# Patient Record
Sex: Male | Born: 1970
Health system: Southern US, Community
[De-identification: ages and names within clinical notes are randomized; demographics above are authoritative.]

## PROBLEM LIST (undated history)

## (undated) DIAGNOSIS — G4730 Sleep apnea, unspecified: Secondary | ICD-10-CM

## (undated) DIAGNOSIS — E119 Type 2 diabetes mellitus without complications: Secondary | ICD-10-CM

## (undated) DIAGNOSIS — I10 Essential (primary) hypertension: Secondary | ICD-10-CM

## (undated) DIAGNOSIS — L409 Psoriasis, unspecified: Secondary | ICD-10-CM

## (undated) HISTORY — PX: FRACTURE SURGERY: SHX138

## (undated) HISTORY — DX: Type 2 diabetes mellitus without complications: E11.9

## (undated) HISTORY — DX: Sleep apnea, unspecified: G47.30

## (undated) HISTORY — DX: Psoriasis, unspecified: L40.9

---

## 2008-09-21 ENCOUNTER — Ambulatory Visit: Payer: Self-pay | Admitting: Internal Medicine

## 2009-07-12 ENCOUNTER — Ambulatory Visit: Payer: Self-pay | Admitting: Podiatry

## 2009-07-13 ENCOUNTER — Ambulatory Visit: Payer: Self-pay | Admitting: Podiatry

## 2010-09-18 ENCOUNTER — Ambulatory Visit: Payer: Self-pay | Admitting: Family Medicine

## 2012-09-20 ENCOUNTER — Ambulatory Visit: Payer: Self-pay | Admitting: Emergency Medicine

## 2012-09-20 LAB — DOT URINE DIP
Blood: NEGATIVE
Specific Gravity: 1.01 (ref 1.003–1.030)

## 2013-08-04 ENCOUNTER — Ambulatory Visit: Payer: Self-pay

## 2013-08-04 LAB — DOT URINE DIP
Blood: NEGATIVE
Glucose,UR: NEGATIVE mg/dL (ref 0–75)
PROTEIN: NEGATIVE
SPECIFIC GRAVITY: 1.025 (ref 1.003–1.030)

## 2014-07-04 ENCOUNTER — Ambulatory Visit: Payer: Self-pay | Admitting: Family Medicine

## 2018-05-17 ENCOUNTER — Ambulatory Visit
Admission: EM | Admit: 2018-05-17 | Discharge: 2018-05-17 | Disposition: A | Discharge status: Home / Self Care | Payer: Managed Care, Other (non HMO) | Attending: Family Medicine | Admitting: Family Medicine

## 2018-05-17 ENCOUNTER — Encounter: Payer: Self-pay | Admitting: Emergency Medicine

## 2018-05-17 ENCOUNTER — Other Ambulatory Visit: Payer: Self-pay

## 2018-05-17 DIAGNOSIS — R509 Fever, unspecified: Secondary | ICD-10-CM

## 2018-05-17 DIAGNOSIS — R05 Cough: Secondary | ICD-10-CM | POA: Diagnosis not present

## 2018-05-17 DIAGNOSIS — R112 Nausea with vomiting, unspecified: Secondary | ICD-10-CM

## 2018-05-17 DIAGNOSIS — J101 Influenza due to other identified influenza virus with other respiratory manifestations: Secondary | ICD-10-CM | POA: Diagnosis not present

## 2018-05-17 DIAGNOSIS — Z87891 Personal history of nicotine dependence: Secondary | ICD-10-CM | POA: Diagnosis not present

## 2018-05-17 DIAGNOSIS — J111 Influenza due to unidentified influenza virus with other respiratory manifestations: Secondary | ICD-10-CM

## 2018-05-17 LAB — RAPID INFLUENZA A&B ANTIGENS
Influenza A (ARMC): POSITIVE — AB
Influenza B (ARMC): NEGATIVE

## 2018-05-17 MED ORDER — OSELTAMIVIR PHOSPHATE 75 MG PO CAPS: 75.0000 mg | ORAL_CAPSULE | Freq: Two times a day (BID) | ORAL | 0 refills | Status: DC

## 2018-05-17 NOTE — ED Triage Notes (Signed)
Pt c/o cough, headache, diarrhea, body aches, and vomiting. Started 3 days ago. Thinks he may have had a fever this morning but did not check it.

## 2018-05-17 NOTE — ED Provider Notes (Signed)
[facility]  CSN: [id number] Arrival date & time: 05/17/18  1845  History   Chief Complaint Suspected influenza  HPI  47 year old male presents with multiple symptoms.  Started on Sunday morning. Reports nausea, vomiting, diarrhea, body aches, chills, headache, cough, subjective fever. No reported sick contacts. No known exacerbating or relieving factors. Symptoms are severe. No other symptoms. No other complaints.   PMH, Surgical Hx, Family Hx, Social History reviewed and updated as below.  PMH: Obesity  Past Surgical History:  Procedure Laterality Date  . FRACTURE SURGERY     right lower leg   Home Medications    Prior to Admission medications   Medication Sig Start Date End Date Taking? Authorizing Provider  oseltamivir (TAMIFLU) 75 MG capsule Take 1 capsule (75 mg total) by mouth every 12 (twelve) hours. 05/17/18   Tommie Samsook, Sheritta Deeg G, DO    Family History Family History  Problem Relation Age of Onset  . Diabetes Mother     Social History Social History   Tobacco Use  . Smoking status: Former Games developermoker  . Smokeless tobacco: Never Used  Substance Use Topics  . Alcohol use: Yes    Frequency: Never  . Drug use: Never     Allergies   Percocet [oxycodone-acetaminophen]   Review of Systems Review of Systems Per HPI  Physical Exam Triage Vital Signs ED Triage Vitals  Enc Vitals Group     BP 05/17/18 1915 109/66     Pulse Rate 05/17/18 1915 94     Resp 05/17/18 1915 18     Temp 05/17/18 1915 99.7 F (37.6 C)     Temp Source 05/17/18 1915 Oral     SpO2 05/17/18 1915 98 %     Weight 05/17/18 1911 296 lb (134.3 kg)     Height 05/17/18 1911 5\' 7"  (1.702 m)     Head Circumference --      Peak Flow --      Pain Score 05/17/18 1911 8     Pain Loc --      Pain Edu? --      Excl. in [place]? --    Updated Vital Signs BP 109/66 (BP Location: Left Arm)   Pulse 94   Temp 99.7 F (37.6 C) (Oral)   Resp 18   Ht 5\' 7"  (1.702 m)   Wt 134.3 kg   SpO2 98%    BMI 46.36 kg/m   Visual Acuity Right Eye Distance:   Left Eye Distance:   Bilateral Distance:    Right Eye Near:   Left Eye Near:    Bilateral Near:     Physical Exam Vitals signs and nursing note reviewed.  Constitutional:      General: He is not in acute distress. HENT:     Head: Normocephalic and atraumatic.  Eyes:     General:        Right eye: No discharge.        Left eye: No discharge.     Conjunctiva/sclera: Conjunctivae normal.  Cardiovascular:     Rate and Rhythm: Normal rate and regular rhythm.  Pulmonary:     Effort: Pulmonary effort is normal.     Breath sounds: No wheezing, rhonchi or rales.  Neurological:     Mental Status: He is alert.  Psychiatric:        Mood and Affect: Mood normal.        Behavior: Behavior normal.    UC Treatments / Results  Labs (  all labs ordered are listed, but only abnormal results are displayed) Labs Reviewed  RAPID INFLUENZA A&B ANTIGENS ([facility] ONLY) - Abnormal; Notable for the following components:      Result Value   Influenza A ([facility]) POSITIVE (*)    All other components within normal limits    EKG None  Radiology No results found.  Procedures Procedures (including critical care time)  Medications Ordered in UC Medications - No data to display  Initial Impression / Assessment and Plan / UC Course  I have reviewed the triage vital signs and the nursing notes.  Pertinent labs & imaging results that were available during my care of the patient were reviewed by me and considered in my medical decision making (see chart for details).     47 year old male presents with influenza. Treating with tamiflu.  Final Clinical Impressions(s) / UC Diagnoses   Final diagnoses:  Influenza   Discharge Instructions   None    ED Prescriptions    Medication Sig Dispense Auth. Provider   oseltamivir (TAMIFLU) 75 MG capsule Take 1 capsule (75 mg total) by mouth every 12 (twelve) hours. 10 capsule Tommie Samsook, Elliette Seabolt G, DO      Controlled Substance Prescriptions [place] Controlled Substance Registry consulted? Not Applicable   Tommie SamsCook, Matty Vanroekel G, DO 05/18/18 0004

## 2019-04-16 ENCOUNTER — Other Ambulatory Visit: Payer: Self-pay

## 2019-04-16 ENCOUNTER — Ambulatory Visit
Admission: EM | Admit: 2019-04-16 | Discharge: 2019-04-16 | Disposition: A | Discharge status: Home / Self Care | Payer: BC Managed Care – PPO | Attending: Emergency Medicine | Admitting: Emergency Medicine

## 2019-04-16 ENCOUNTER — Encounter: Payer: Self-pay | Admitting: Emergency Medicine

## 2019-04-16 DIAGNOSIS — R1032 Left lower quadrant pain: Secondary | ICD-10-CM | POA: Diagnosis not present

## 2019-04-16 DIAGNOSIS — K5792 Diverticulitis of intestine, part unspecified, without perforation or abscess without bleeding: Secondary | ICD-10-CM

## 2019-04-16 MED ORDER — ACETAMINOPHEN 500 MG PO TABS
1000.0000 mg | ORAL_TABLET | Freq: Once | ORAL | Status: AC
  Administered 2019-04-16: 1000 mg via ORAL

## 2019-04-16 MED ORDER — KETOROLAC TROMETHAMINE 60 MG/2ML IM SOLN
30.0000 mg | Freq: Once | INTRAMUSCULAR | Status: AC
  Administered 2019-04-16: 30 mg via INTRAMUSCULAR

## 2019-04-16 MED ORDER — IBUPROFEN 600 MG PO TABS: 600.0000 mg | ORAL_TABLET | Freq: Four times a day (QID) | ORAL | 0 refills | Status: DC | PRN

## 2019-04-16 MED ORDER — AMOXICILLIN-POT CLAVULANATE 875-125 MG PO TABS: 1.0000 | ORAL_TABLET | Freq: Three times a day (TID) | ORAL | 0 refills | Status: AC

## 2019-04-16 NOTE — Discharge Instructions (Addendum)
600 mg ibuprofen combined with 1 g of Tylenol 3-4 times a day as needed for pain.  Finish the Augmentin, even if you feel better.  Here is a list of primary care providers who are taking new patients:  Dr. Otilio Miu, Dr. Adline Potter [address] [place] [address] [place] [address]  [phone]  [facility] [address] [place], [place] [address] [phone]  [facility]  [phone] [place], [place] [address]  Here are clinics/ other resources who will see you if you do not have insurance. Some have certain criteria that you must meet. Call them and find out what they are:  [facility]: [address] [place], [place] [address] Phone: [phone] Hours: First and Third Saturdays of each Month, 9 a.m. - 1 p.m.  [facility]: [address], [place], [place], [place] [address] Phone: [phone] Hours: Tuesday, 4 p.m. - 8 p.m. Thursday, 1 p.m. - 8 p.m. Wednesday, 9 a.m. - [facility] [address] [place], [place] [address] Phone: [phone] Pharmacy Phone Number: [phone] Dental Phone Number: [phone] [place] Help: [phone]  Dental Hours: Monday - Thursday, 8 a.m. - 6 p.m.  [facility] [address], [place], [place] [address] Phone: [phone] Pharmacy Phone Number: [phone] [facility] Insurance Help: [phone]  [facility] [address] [place], [place] [address] Phone: [phone] Pharmacy Phone Number: [phone] [facility] Insurance Help: [phone]  [facility] [address] [place], [place] [address] Phone: [phone] [facility] Insurance Help: [phone]   [place] [place],  [address] Phone: [phone]  Go to www.goodrx.com to look up your medications. This will  give you a list of where you can find your prescriptions at the most affordable prices. Or ask the pharmacist what the cash price is, or if they have any other discount programs available to help make your medication more affordable. This can be less expensive than what you would pay with insurance.

## 2019-04-16 NOTE — ED Triage Notes (Signed)
Pt c/o LLQ pain. Started about 2 days ago. He states that he has had this exactly pain before and was given antibiotics for it. Pt states pain is intermittent and when he has the pain it is a 7/10 and sharp.

## 2019-04-16 NOTE — ED Provider Notes (Signed)
HPI  SUBJECTIVE:  Brandon Green is a 48 y.o. male who presents with 2 days of intermittent sharp, stabbing left lower quadrant pain.  It does not migrate, radiate.  Lasts 15 to 20 seconds and then resolves.  It is not getting worse or more frequent.  Had a normal bowel movement this morning.  Denies nausea, vomiting, fevers, abdominal distention, back pain, testicular pain, swelling, penile rash, discharge, urinary complaints.  No antipyretic in the past 4 to 6 hours.  No anorexia.  States he had identical pain 3 years ago and was treated with antibiotics.  Not sure what the diagnosis was.  He is in a  long-term monogamous sexual relationship with a male who is asymptomatic.  STDs are not a concern today.  He tried applying pressure, stretching, flexing his left leg.  These seem to help.  No aggravating factors.  The pain Is not associated with eating, drinking, urination, defecation, movement.  The car ride over here was not painful.  Questionable history of diverticulitis.  No history of abdominal surgeries, UTI, pyelonephritis, gonorrhea, chlamydia, orchitis, epididymitis, chronic kidney disease, diabetes, hypertension.  PMD: None.    History reviewed. No pertinent past medical history.  Past Surgical History:  Procedure Laterality Date  . FRACTURE SURGERY     right lower leg    Family History  Problem Relation Age of Onset  . Diabetes Mother     Social History   Tobacco Use  . Smoking status: Former Research scientist (life sciences)  . Smokeless tobacco: Never Used  Substance Use Topics  . Alcohol use: Yes    Frequency: Never  . Drug use: Never    No current facility-administered medications for this encounter.   Current Outpatient Medications:  .  amoxicillin-clavulanate (AUGMENTIN) 875-125 MG tablet, Take 1 tablet by mouth 3 (three) times daily for 7 days., Disp: 21 tablet, Rfl: 0 .  ibuprofen (ADVIL) 600 MG tablet, Take 1 tablet (600 mg total) by mouth every 6 (six) hours as needed., Disp: 30  tablet, Rfl: 0  Allergies  Allergen Reactions  . Percocet [Oxycodone-Acetaminophen] Itching     ROS  As noted in HPI.   Physical Exam  BP (!) 132/94 (BP Location: Right Arm)   Pulse 83   Temp 98.2 F (36.8 C) (Oral)   Resp 20   Ht 5\' 8"  (1.727 m)   Wt 130.6 kg   SpO2 98%   BMI 43.79 kg/m   Constitutional: Well developed, well nourished, no acute distress having spasms of pain Eyes:  EOMI, conjunctiva normal bilaterally HENT: Normocephalic, atraumatic,mucus membranes moist Respiratory: Normal inspiratory effort Cardiovascular: Normal rate GI: Normal appearance, obese, soft.  Nontender, nondistended.  Diminished but present bowel sounds.  No guarding, rebound.   GU: Normal circumcised male, testes descended bilaterally.  No testicular or epididymal swelling, tenderness.  Normal scrotum.  No appreciable inguinal hernia.  Skin: No rash, skin intact Musculoskeletal: no deformities Neurologic: Alert & oriented x 3, no focal neuro deficits Psychiatric: Speech and behavior appropriate   ED Course   Medications  ketorolac (TORADOL) injection 30 mg (30 mg Intramuscular Given 04/16/19 1053)  acetaminophen (TYLENOL) tablet 1,000 mg (1,000 mg Oral Given 04/16/19 1052)    No orders of the defined types were placed in this encounter.   No results found for this or any previous visit (from the past 24 hour(s)). No results found.  ED Clinical Impression  1. Diverticulitis      ED Assessment/Plan  will give Toradol 30 mg IM combined  with 1 g of Tylenol.  Suspect uncomplicated diverticulitis.  In the differential is nephrolithiasis, colitis.  His abdomen is benign.  Doubt perforation, obstruction, abscess.  He has very localized pain in the left lower quadrant.  It does not appear to be over the suprapubic or left flank region, or from a GU cause.  He has no CVAT.  Considered doing labs, patient is otherwise young, healthy, has no history of chronic kidney disease/renal  insufficiency.  I discussed doing labs with the patient, but I will be treating him empirically for diverticulitis.  He will go to the ER if he does not get better in 24 to 36 hours, if he gets worse in any way.  Home with Augmentin 875 mg 3 times daily for 7 days, Tylenol/ibuprofen combination 3-4 times a day.  Also providing primary care list for routine care.  Discussed MDM, treatment plan, and plan for follow-up with patient. Discussed sn/sx that should prompt return to the ED. patient agrees with plan.   Meds ordered this encounter  Medications  . ketorolac (TORADOL) injection 30 mg  . acetaminophen (TYLENOL) tablet 1,000 mg  . amoxicillin-clavulanate (AUGMENTIN) 875-125 MG tablet    Sig: Take 1 tablet by mouth 3 (three) times daily for 7 days.    Dispense:  21 tablet    Refill:  0  . ibuprofen (ADVIL) 600 MG tablet    Sig: Take 1 tablet (600 mg total) by mouth every 6 (six) hours as needed.    Dispense:  30 tablet    Refill:  0    *This clinic note was created using Scientist, clinical (histocompatibility and immunogenetics). Therefore, there may be occasional mistakes despite careful proofreading.   ?    Domenick Gong, MD 04/17/19 1554

## 2019-07-06 DIAGNOSIS — Z79899 Other long term (current) drug therapy: Secondary | ICD-10-CM | POA: Diagnosis not present

## 2019-07-06 DIAGNOSIS — L400 Psoriasis vulgaris: Secondary | ICD-10-CM | POA: Diagnosis not present

## 2019-09-30 ENCOUNTER — Ambulatory Visit
Admission: EM | Admit: 2019-09-30 | Discharge: 2019-09-30 | Disposition: A | Discharge status: Home / Self Care | Payer: BC Managed Care – PPO | Attending: Family Medicine | Admitting: Family Medicine

## 2019-09-30 ENCOUNTER — Encounter: Payer: Self-pay | Admitting: Emergency Medicine

## 2019-09-30 ENCOUNTER — Other Ambulatory Visit: Payer: Self-pay

## 2019-09-30 DIAGNOSIS — G5621 Lesion of ulnar nerve, right upper limb: Secondary | ICD-10-CM | POA: Diagnosis not present

## 2019-09-30 NOTE — Discharge Instructions (Signed)
Rest, ice, anti-inflammatories, wrist splint Follow up with orthopedist if no improvment

## 2019-09-30 NOTE — ED Triage Notes (Signed)
Patient c/o numbness in his right wrist and right 4th and 5th finger that started this morning. Patient denies any pain. Patient denies any injury.

## 2019-10-02 NOTE — ED Provider Notes (Signed)
[facility]    CSN: [id number] Arrival date & time: 09/30/19  0945      History   Chief Complaint Chief Complaint  Patient presents with  . Numbness    HPI Brandon Green is a 49 y.o. male.   49 yo male with a c/o right wrist and 4th and 5th finger numbness since this morning. Denies any fall or other traumatic injury. 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 Denies any fevers, chills.      History reviewed. No pertinent past medical history.  There are no problems to display for this patient.   Past Surgical History:  Procedure Laterality Date  . FRACTURE SURGERY     right lower leg       Home Medications    Prior to Admission medications   Medication Sig Start Date End Date Taking? Authorizing Provider  ibuprofen (ADVIL) 600 MG tablet Take 1 tablet (600 mg total) by mouth every 6 (six) hours as needed. 04/16/19   Melynda Ripple, MD    Family History Family History  Problem Relation Age of Onset  . Diabetes Mother     Social History Social History   Tobacco Use  . Smoking status: Former Research scientist (life sciences)  . Smokeless tobacco: Never Used  Substance Use Topics  . Alcohol use: Yes  . Drug use: Never     Allergies   Percocet [oxycodone-acetaminophen]   Review of Systems Review of Systems   Physical Exam Triage Vital Signs ED Triage Vitals  Enc Vitals Group     BP 09/30/19 0957 131/84     Pulse Rate 09/30/19 0957 80     Resp 09/30/19 0957 16     Temp 09/30/19 0957 98.1 F (36.7 C)     Temp Source 09/30/19 0957 Oral     SpO2 09/30/19 0957 100 %     Weight 09/30/19 0955 290 lb (131.5 kg)     Height 09/30/19 0955 5\' 6"  (1.676 m)     Head Circumference --      Peak Flow --      Pain Score 09/30/19 0955 0     Pain Loc --      Pain Edu? --      Excl. in [place]? --    No data found.  Updated Vital Signs BP 131/84 (BP Location: Right Arm)   Pulse 80   Temp 98.1 F  (36.7 C) (Oral)   Resp 16   Ht 5\' 6"  (1.676 m)   Wt 131.5 kg   SpO2 100%   BMI 46.81 kg/m   Visual Acuity Right Eye Distance:   Left Eye Distance:   Bilateral Distance:    Right Eye Near:   Left Eye Near:    Bilateral Near:     Physical Exam Vitals and nursing note reviewed.  Constitutional:      General: He is not in acute distress.    Appearance: He is not toxic-appearing or diaphoretic.  Cardiovascular:     Pulses: Normal pulses.  Musculoskeletal:     Right wrist: No swelling, deformity, effusion, lacerations, tenderness, bony tenderness, snuff box tenderness or crepitus. Normal range of motion. Normal pulse.  Neurological:     Mental Status: He is alert.      UC Treatments / Results  Labs (all labs ordered are listed, but only abnormal results are displayed) Labs Reviewed - No data to display  EKG  Radiology No results found.  Procedures Procedures (including critical care time)  Medications Ordered in UC Medications - No data to display  Initial Impression / Assessment and Plan / UC Course  I have reviewed the triage vital signs and the nursing notes.  Pertinent labs & imaging results that were available during my care of the patient were reviewed by me and considered in my medical decision making (see chart for details).      Final Clinical Impressions(s) / UC Diagnoses   Final diagnoses:  Ulnar neuropathy at wrist, right     Discharge Instructions     Rest, ice, anti-inflammatories, wrist splint Follow up with orthopedist if no improvment    ED Prescriptions    None      1. diagnosis reviewed with patient 2. Recommend supportive treatment as above 3. Follow up with orthopedist if no improvement 4. Follow-up prn    PDMP not reviewed this encounter.   Payton Mccallum, MD 10/02/19 1745

## 2019-10-03 DIAGNOSIS — Z79899 Other long term (current) drug therapy: Secondary | ICD-10-CM | POA: Diagnosis not present

## 2019-10-03 DIAGNOSIS — L400 Psoriasis vulgaris: Secondary | ICD-10-CM | POA: Diagnosis not present

## 2019-10-25 ENCOUNTER — Encounter: Payer: Self-pay | Admitting: Emergency Medicine

## 2019-10-25 ENCOUNTER — Other Ambulatory Visit: Payer: Self-pay

## 2019-10-25 ENCOUNTER — Ambulatory Visit
Admission: EM | Admit: 2019-10-25 | Discharge: 2019-10-25 | Disposition: A | Discharge status: Home / Self Care | Payer: BC Managed Care – PPO | Attending: Emergency Medicine | Admitting: Emergency Medicine

## 2019-10-25 DIAGNOSIS — Z87891 Personal history of nicotine dependence: Secondary | ICD-10-CM | POA: Insufficient documentation

## 2019-10-25 DIAGNOSIS — R5381 Other malaise: Secondary | ICD-10-CM | POA: Insufficient documentation

## 2019-10-25 DIAGNOSIS — Z833 Family history of diabetes mellitus: Secondary | ICD-10-CM | POA: Insufficient documentation

## 2019-10-25 DIAGNOSIS — Z20822 Contact with and (suspected) exposure to covid-19: Secondary | ICD-10-CM | POA: Insufficient documentation

## 2019-10-25 DIAGNOSIS — R519 Headache, unspecified: Secondary | ICD-10-CM | POA: Insufficient documentation

## 2019-10-25 DIAGNOSIS — Z7984 Long term (current) use of oral hypoglycemic drugs: Secondary | ICD-10-CM | POA: Insufficient documentation

## 2019-10-25 DIAGNOSIS — R5383 Other fatigue: Secondary | ICD-10-CM | POA: Diagnosis not present

## 2019-10-25 DIAGNOSIS — E119 Type 2 diabetes mellitus without complications: Secondary | ICD-10-CM | POA: Diagnosis not present

## 2019-10-25 LAB — BASIC METABOLIC PANEL
Anion gap: 9 (ref 5–15)
BUN: 11 mg/dL (ref 6–20)
CO2: 25 mmol/L (ref 22–32)
Calcium: 8.8 mg/dL — ABNORMAL LOW (ref 8.9–10.3)
Chloride: 102 mmol/L (ref 98–111)
Creatinine, Ser: 0.64 mg/dL (ref 0.61–1.24)
GFR calc Af Amer: >60 mL/min (ref >60)
GFR calc non Af Amer: >60 mL/min (ref >60)
Glucose, Bld: 238 mg/dL — ABNORMAL HIGH (ref 70–99)
Potassium: 4 mmol/L (ref 3.5–5.1)
Sodium: 136 mmol/L (ref 135–145)

## 2019-10-25 LAB — URINALYSIS, COMPLETE (UACMP) WITH MICROSCOPIC
Bilirubin Urine: NEGATIVE
Glucose, UA: >1000 mg/dL — AB
Hgb urine dipstick: NEGATIVE
Leukocytes,Ua: NEGATIVE
Nitrite: NEGATIVE
Protein, ur: NEGATIVE mg/dL
RBC / HPF: NONE SEEN RBC/hpf (ref 0–5)
Specific Gravity, Urine: 1.025 (ref 1.005–1.030)
pH: 6.5 (ref 5.0–8.0)

## 2019-10-25 LAB — HEMOGLOBIN A1C
Hgb A1c MFr Bld: 10.8 % — ABNORMAL HIGH (ref 4.8–5.6)
Mean Plasma Glucose: 263.26 mg/dL

## 2019-10-25 LAB — CK: Total CK: 321 U/L (ref 49–397)

## 2019-10-25 MED ORDER — METFORMIN HCL 500 MG PO TABS
500.0000 mg | ORAL_TABLET | Freq: Every day | ORAL | 0 refills | Status: DC
Start: 2019-10-25 — End: 2019-12-06

## 2019-10-25 MED ORDER — FREESTYLE FREEDOM KIT: 1.0000 [IU] | PACK | Freq: Two times a day (BID) | 0 refills | Status: AC

## 2019-10-25 MED ORDER — GLUCOSE BLOOD VI STRP: ORAL_STRIP | 2 refills | Status: AC

## 2019-10-25 NOTE — ED Provider Notes (Signed)
HPI  SUBJECTIVE:  Brandon Green is a 49 y.o. male who reports a diffuse gradual onset headache starting yesterday, body aches in his arms and neck after working outside in the heat for 2 days in a row over the weekend.  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  States that he is extremely thirsty.  States he vomited up the Gatorade yesterday, but has been tolerating p.o. since then.  States that the heat made him feel "dizzy".  He reports dark, tea colored urine which has lightened up and become normal color after pushing fluids.  No fevers, nasal congestion, sore throat, loss of sense of smell or taste, cough, shortness of breath nausea, abdominal pain, diarrhea.  No known Covid exposure.  He has not yet gotten his vaccine.  No lower extremity edema, neck stiffness, photophobia, phonophobia.  He reports fatigue, malaise.  He has never had symptoms like this before.  He has tried pushing fluids and taking 400 mg of Advil 2-3 times a day with improvement in his headache.  No aggravating factors.  On further history, he reports polyuria and a 15 pound unintentional weight loss in the past month to month and a half.  Past medical history negative for rhabdomyolysis, diabetes, hypertension.  PMD: None.   History reviewed. No pertinent past medical history.  Past Surgical History:  Procedure Laterality Date  . FRACTURE SURGERY     right lower leg    Family History  Problem Relation Age of Onset  . Diabetes Mother     Social History   Tobacco Use  . Smoking status: Former Research scientist (life sciences)  . Smokeless tobacco: Never Used  Substance Use Topics  . Alcohol use: Yes  . Drug use: Never    No current facility-administered medications for this encounter.  Current Outpatient Medications:  .  Blood Glucose Monitoring Suppl (FREESTYLE FREEDOM) KIT, 1 Units by Does not apply route in the morning and at bedtime., Disp: 1 kit, Rfl: 0 .  glucose blood test strip, Check your sugar in  the morning before you eat breakfast, and one hour after a meal., Disp: 100 each, Rfl: 2 .  ibuprofen (ADVIL) 600 MG tablet, Take 1 tablet (600 mg total) by mouth every 6 (six) hours as needed., Disp: 30 tablet, Rfl: 0 .  metFORMIN (GLUCOPHAGE) 500 MG tablet, Take 1 tablet (500 mg total) by mouth daily with breakfast. 1 tab po in the a.m. for 1 week, 1 tab twice a day for 1 week, then 1 tab in am and 2 tabs at night for the next week, Disp: 42 tablet, Rfl: 0  Allergies  Allergen Reactions  . Percocet [Oxycodone-Acetaminophen] Itching     ROS  As noted in HPI.   Physical Exam  BP 137/87 (BP Location: Right Arm)   Pulse 77   Temp 98.2 F (36.8 C) (Oral)   Resp 18   Ht _0  (1.676 m)   Wt 129.3 kg   SpO2 98%   BMI 46.00 kg/m   Constitutional: Well developed, well nourished, no acute distress Eyes: PERRL, EOMI, conjunctiva normal bilaterally.  No photophobia HENT: Normocephalic, atraumatic,mucus membranes moist,  No nasal congestion, no sinus tenderness. No temporal artery tenderness.  Neck: no cervical LN + Right trapezial muscle tenderness. No meningismus Respiratory: normal inspiratory effort lungs clear bilaterally Cardiovascular: Normal rate, regular rhythm GI:  nondistended skin: No rash, skin intact Musculoskeletal: No edema, no tenderness, no deformities Neurologic: Alert & oriented x 3, CN  III-XII intact, coordination grossly normal tandem gait steady Psychiatric: Speech and behavior appropriate   ED Course  Medications - No data to display  Orders Placed This Encounter  Procedures  . SARS CORONAVIRUS 2 (TAT 6-24 HRS) Nasopharyngeal Nasopharyngeal Swab    Standing Status:   Standing    Number of Occurrences:   1    Order Specific Question:   Is this test for diagnosis or screening    Answer:   Diagnosis of ill patient    Order Specific Question:   Symptomatic for COVID-19 as defined by CDC    Answer:   Yes    Order Specific Question:   Date of Symptom Onset     Answer:   10/23/2019    Order Specific Question:   Hospitalized for COVID-19    Answer:   No    Order Specific Question:   Admitted to ICU for COVID-19    Answer:   No    Order Specific Question:   Previously tested for COVID-19    Answer:   No    Order Specific Question:   Resident in a congregate (group) care setting    Answer:   No    Order Specific Question:   Employed in healthcare setting    Answer:   No    Order Specific Question:   Has patient completed COVID vaccination(s) (2 doses of Pfizer/Moderna 1 dose of [employer])    Answer:   No  . Basic metabolic panel    Standing Status:   Standing    Number of Occurrences:   1  . Urinalysis, Complete w Microscopic    Standing Status:   Standing    Number of Occurrences:   1  . CK    Standing Status:   Standing    Number of Occurrences:   1  . Hemoglobin A1c    Standing Status:   Standing    Number of Occurrences:   1   Results for orders placed or performed during the hospital encounter of 10/25/19 (from the past 24 hour(s))  SARS CORONAVIRUS 2 (TAT 6-24 HRS) Nasopharyngeal Nasopharyngeal Swab     Status: None   Collection Time: 10/25/19  5:35 PM   Specimen: Nasopharyngeal Swab  Result Value Ref Range   SARS Coronavirus 2 NEGATIVE NEGATIVE  Basic metabolic panel     Status: Abnormal   Collection Time: 10/25/19  6:55 PM  Result Value Ref Range   Sodium 136 135 - 145 mmol/L   Potassium 4.0 3.5 - 5.1 mmol/L   Chloride 102 98 - 111 mmol/L   CO2 25 22 - 32 mmol/L   Glucose, Bld 238 (H) 70 - 99 mg/dL   BUN 11 6 - 20 mg/dL   Creatinine, Ser 0.64 0.61 - 1.24 mg/dL   Calcium 8.8 (L) 8.9 - 10.3 mg/dL   GFR calc non Af Amer >60 >60 mL/min   GFR calc Af Amer >60 >60 mL/min   Anion gap 9 5 - 15  Urinalysis, Complete w Microscopic     Status: Abnormal   Collection Time: 10/25/19  6:55 PM  Result Value Ref Range   Color, Urine YELLOW YELLOW   APPearance CLEAR CLEAR   Specific Gravity, Urine 1.025 1.005 - 1.030   pH  6.5 5.0 - 8.0   Glucose, UA >1,000 (A) NEGATIVE mg/dL   Hgb urine dipstick NEGATIVE NEGATIVE   Bilirubin Urine NEGATIVE NEGATIVE   Ketones, ur TRACE (A) NEGATIVE mg/dL  Protein, ur NEGATIVE NEGATIVE mg/dL   Nitrite NEGATIVE NEGATIVE   Leukocytes,Ua NEGATIVE NEGATIVE   Squamous Epithelial / LPF 0-5 0 - 5   WBC, UA 0-5 0 - 5 WBC/hpf   RBC / HPF NONE SEEN 0 - 5 RBC/hpf   Bacteria, UA FEW (A) NONE SEEN   Mucus PRESENT   CK     Status: None   Collection Time: 10/25/19  6:55 PM  Result Value Ref Range   Total CK 321 49 - 397 U/L  Hemoglobin A1c     Status: Abnormal   Collection Time: 10/25/19  6:55 PM  Result Value Ref Range   Hgb A1c MFr Bld 10.8 (H) 4.8 - 5.6 %   Mean Plasma Glucose 263.26 mg/dL   No results found.   ED Clinical Impression  1. New onset type 2 diabetes mellitus [facility])     ED Assessment/Plan  Suspect dehydration.  Will get UA, CK, BMP to rule out rhabdomyolysis.  No evidence of meningitis.  Patient given liter of Pedialyte to drink.  Patient has new onset diabetes.  He has glucosria, trace ketones and his glucose is 238.  His CK is within normal limits.  His kidney function is normal.  He is not acidotic.  Doubt DKA.   Sending hemoglobin A1c for baseline.  Starting on Metformin, rx'ing glucometer and glucose strips.  Check blood sugar twice a day.  will provide primary care list for further care.  Work note for 2 days.  Hemoglobin A1c 10.8. Covid test negative.  Discussed labs, MDM, plan for follow up, signs and sx that should prompt return to ER. Pt agrees with plan  Meds ordered this encounter  Medications  . metFORMIN (GLUCOPHAGE) 500 MG tablet    Sig: Take 1 tablet (500 mg total) by mouth daily with breakfast. 1 tab po in the a.m. for 1 week, 1 tab twice a day for 1 week, then 1 tab in am and 2 tabs at night for the next week    Dispense:  42 tablet    Refill:  0  . glucose blood test strip    Sig: Check your sugar in the morning before you eat  breakfast, and one hour after a meal.    Dispense:  100 each    Refill:  2  . Blood Glucose Monitoring Suppl (FREESTYLE FREEDOM) KIT    Sig: 1 Units by Does not apply route in the morning and at bedtime.    Dispense:  1 kit    Refill:  0    *This clinic note was created using Lobbyist. Therefore, there may be occasional mistakes despite careful proofreading.  ?    Melynda Ripple, MD 10/26/19 1024

## 2019-10-25 NOTE — ED Triage Notes (Signed)
Patient states he was working outside on Saturday and started to feel light headed in the heat. He states Sunday he went out and cut grass all day. He reports a headache that has not resolved since yesterday.

## 2019-10-25 NOTE — Discharge Instructions (Addendum)
You have diabetes.  I am sending off a hemoglobin A1c as a baseline to see how far advanced you are.  I am starting you on Metformin, a glucometer.  Check your sugar once before eating breakfast and 1 hour hour after dinner.  Keep a log of your blood sugars.  You must see a primary care provider as soon as you possibly can.  See list below.  Here is a list of primary care providers who are taking new patients:  Dr. Elizabeth Sauer [address] [place] [address] [phone]  [facility] [address] [place] [address]  [phone]  [facility] [address] [place], [place] [address] [phone]  [facility] [address] [place]  [phone] [place], [place] [address]  Here are clinics/ other resources who will see you if you do not have insurance. Some have certain criteria that you must meet. Call them and find out what they are:  [facility]: [address], [place], [place] [address] Phone: [phone] Hours: First and Third Saturdays of each Month, 9 a.m. - 1 p.m.  [facility]: [address], [address] [place], [place] [address] Phone: [phone] Hours: Tuesday, 4 p.m. - 8 p.m. Thursday, 1 p.m. - 8 p.m. Wednesday, 9 a.m. - [facility] [address], [place], [place] [address] Phone: [phone] Pharmacy Phone Number: [phone] Dental Phone Number: [phone] [facility] Insurance Help: [phone]  Dental Hours: Monday - Thursday, 8 a.m. - 6 p.m.  [facility] [address], [place], [place] [address] Phone: [phone] Pharmacy Phone Number: [phone] [facility] Insurance Help: [phone]  [facility] [address] [place] [place], [place] [address] Phone: [phone] Pharmacy Phone Number: [phone] [facility] Insurance Help: [phone]  [facility] [address] [place], [place] [address] Phone:  [phone] [facility] Insurance Help: [phone]   [facility] [address] [place], [place] [address] Phone: [phone]  Go to www.goodrx.com to look up your medications. This will give you a list of where you can find your prescriptions at the most affordable prices. Or ask the pharmacist what the cash price is, or if they have any other discount programs available to help make your medication more affordable. This can be less expensive than what you would pay with insurance.

## 2019-10-26 LAB — SARS CORONAVIRUS 2 (TAT 6-24 HRS): SARS Coronavirus 2: NEGATIVE

## 2019-12-06 ENCOUNTER — Other Ambulatory Visit: Payer: Self-pay

## 2019-12-06 ENCOUNTER — Ambulatory Visit: Payer: BC Managed Care – PPO | Admitting: Family Medicine

## 2019-12-06 ENCOUNTER — Encounter: Payer: Self-pay | Admitting: Family Medicine

## 2019-12-06 VITALS — BP 130/80 | HR 84 | Ht 67.0 in | Wt 276.0 lb

## 2019-12-06 DIAGNOSIS — E119 Type 2 diabetes mellitus without complications: Secondary | ICD-10-CM

## 2019-12-06 DIAGNOSIS — Z7689 Persons encountering health services in other specified circumstances: Secondary | ICD-10-CM

## 2019-12-06 DIAGNOSIS — F17210 Nicotine dependence, cigarettes, uncomplicated: Secondary | ICD-10-CM | POA: Diagnosis not present

## 2019-12-06 DIAGNOSIS — Z6841 Body Mass Index (BMI) 40.0 and over, adult: Secondary | ICD-10-CM | POA: Diagnosis not present

## 2019-12-06 MED ORDER — METFORMIN HCL ER 750 MG PO TB24: 750.0000 mg | ORAL_TABLET | Freq: Every day | ORAL | 3 refills | Status: DC

## 2019-12-06 NOTE — Patient Instructions (Addendum)
Managing Your Hypertension Hypertension is commonly called high blood pressure. This is when the force of your blood pressing against the walls of your arteries is too strong. Arteries are blood vessels that carry blood from your heart throughout your body. Hypertension forces the heart to work harder to pump blood, and may cause the arteries to become narrow or stiff. Having untreated or uncontrolled hypertension can cause heart attack, stroke, kidney disease, and other problems. What are blood pressure readings? A blood pressure reading consists of a higher number over a lower number. Ideally, your blood pressure should be below 120/80. The first ("top") number is called the systolic pressure. It is a measure of the pressure in your arteries as your heart beats. The second ("bottom") number is called the diastolic pressure. It is a measure of the pressure in your arteries as the heart relaxes. What does my blood pressure reading mean? Blood pressure is classified into four stages. Based on your blood pressure reading, your health care provider may use the following stages to determine what type of treatment you need, if any. Systolic pressure and diastolic pressure are measured in a unit called mm Hg. Normal  Systolic pressure: below 120.  Diastolic pressure: below 80. Elevated  Systolic pressure: 120-129.  Diastolic pressure: below 80. Hypertension stage 1  Systolic pressure: 130-139.  Diastolic pressure: 80-89. Hypertension stage 2  Systolic pressure: 140 or above.  Diastolic pressure: 90 or above. What health risks are associated with hypertension? Managing your hypertension is an important responsibility. Uncontrolled hypertension can lead to:  A heart attack.  A stroke.  A weakened blood vessel (aneurysm).  Heart failure.  Kidney damage.  Eye damage.  Metabolic syndrome.  Memory and concentration problems. What changes can I make to manage my  hypertension? Hypertension can be managed by making lifestyle changes and possibly by taking medicines. Your health care provider will help you make a plan to bring your blood pressure within a normal range. Eating and drinking   Eat a diet that is high in fiber and potassium, and low in salt (sodium), added sugar, and fat. An example eating plan is called the DASH (Dietary Approaches to Stop Hypertension) diet. To eat this way: ? Eat plenty of fresh fruits and vegetables. Try to fill half of your plate at each meal with fruits and vegetables. ? Eat whole grains, such as whole wheat pasta, brown rice, or whole grain bread. Fill about one quarter of your plate with whole grains. ? Eat low-fat diary products. ? Avoid fatty cuts of meat, processed or cured meats, and poultry with skin. Fill about one quarter of your plate with lean proteins such as fish, chicken without skin, beans, eggs, and tofu. ? Avoid premade and processed foods. These tend to be higher in sodium, added sugar, and fat.  Reduce your daily sodium intake. Most people with hypertension should eat less than 1,500 mg of sodium a day.  Limit alcohol intake to no more than 1 drink a day for nonpregnant women and 2 drinks a day for men. One drink equals 12 oz of beer, 5 oz of wine, or 1 oz of hard liquor. Lifestyle  Work with your health care provider to maintain a healthy body weight, or to lose weight. Ask what an ideal weight is for you.  Get at least 30 minutes of exercise that causes your heart to beat faster (aerobic exercise) most days of the week. Activities may include walking, swimming, or biking.  Include exercise   to strengthen your muscles (resistance exercise), such as weight lifting, as part of your weekly exercise routine. Try to do these types of exercises for 30 minutes at least 3 days a week.  Do not use any products that contain nicotine or tobacco, such as cigarettes and e-cigarettes. If you need help quitting,  ask your health care provider.  Control any long-term (chronic) conditions you have, such as high cholesterol or diabetes. Monitoring  Monitor your blood pressure at home as told by your health care provider. Your personal target blood pressure may vary depending on your medical conditions, your age, and other factors.  Have your blood pressure checked regularly, as often as told by your health care provider. Working with your health care provider  Review all the medicines you take with your health care provider because there may be side effects or interactions.  Talk with your health care provider about your diet, exercise habits, and other lifestyle factors that may be contributing to hypertension.  Visit your health care provider regularly. Your health care provider can help you create and adjust your plan for managing hypertension. Will I need medicine to control my blood pressure? Your health care provider may prescribe medicine if lifestyle changes are not enough to get your blood pressure under control, and if:  Your systolic blood pressure is 130 or higher.  Your diastolic blood pressure is 80 or higher. Take medicines only as told by your health care provider. Follow the directions carefully. Blood pressure medicines must be taken as prescribed. The medicine does not work as well when you skip doses. Skipping doses also puts you at risk for problems. Contact a health care provider if:  You think you are having a reaction to medicines you have taken.  You have repeated (recurrent) headaches.  You feel dizzy.  You have swelling in your ankles.  You have trouble with your vision. Get help right away if:  You develop a severe headache or confusion.  You have unusual weakness or numbness, or you feel faint.  You have severe pain in your chest or abdomen.  You vomit repeatedly.  You have trouble breathing. Summary  Hypertension is when the force of blood pumping  through your arteries is too strong. If this condition is not controlled, it may put you at risk for serious complications.  Your personal target blood pressure may vary depending on your medical conditions, your age, and other factors. For most people, a normal blood pressure is less than 120/80.  Hypertension is managed by lifestyle changes, medicines, or both. Lifestyle changes include weight loss, eating a healthy, low-sodium diet, exercising more, and limiting alcohol. This information is not intended to replace advice given to you by your health care provider. Make sure you discuss any questions you have with your health care provider. Document Revised: 08/27/2018 Document Reviewed: 04/02/2016 Elsevier Patient Education  2020 Elsevier Inc.  Carbohydrate Counting for Diabetes Mellitus, Adult  Carbohydrate counting is a method of keeping track of how many carbohydrates you eat. Eating carbohydrates naturally increases the amount of sugar (glucose) in the blood. Counting how many carbohydrates you eat helps keep your blood glucose within normal limits, which helps you manage your diabetes (diabetes mellitus). It is important to know how many carbohydrates you can safely have in each meal. This is different for every person. A diet and nutrition specialist (registered dietitian) can help you make a meal plan and calculate how many carbohydrates you should have at each meal and   snack. Carbohydrates are found in the following foods:  Grains, such as breads and cereals.  Dried beans and soy products.  Starchy vegetables, such as potatoes, peas, and corn.  Fruit and fruit juices.  Milk and yogurt.  Sweets and snack foods, such as cake, cookies, candy, chips, and soft drinks. How do I count carbohydrates? There are two ways to count carbohydrates in food. You can use either of the methods or a combination of both. Reading "Nutrition Facts" on packaged food The "Nutrition Facts" list is  included on the labels of almost all packaged foods and beverages in the [place] It includes:  The serving size.  Information about nutrients in each serving, including the grams (g) of carbohydrate per serving. To use the "Nutrition Facts":  Decide how many servings you will have.  Multiply the number of servings by the number of carbohydrates per serving.  The resulting number is the total amount of carbohydrates that you will be having. Learning standard serving sizes of other foods When you eat carbohydrate foods that are not packaged or do not include "Nutrition Facts" on the label, you need to measure the servings in order to count the amount of carbohydrates:  Measure the foods that you will eat with a food scale or measuring cup, if needed.  Decide how many standard-size servings you will eat.  Multiply the number of servings by 15. Most carbohydrate-rich foods have about 15 g of carbohydrates per serving. ? For example, if you eat 8 oz (170 g) of strawberries, you will have eaten 2 servings and 30 g of carbohydrates (2 servings x 15 g = 30 g).  For foods that have more than one food mixed, such as soups and casseroles, you must count the carbohydrates in each food that is included. The following list contains standard serving sizes of common carbohydrate-rich foods. Each of these servings has about 15 g of carbohydrates:   hamburger bun or  English muffin.   oz (15 mL) syrup.   oz (14 g) jelly.  1 slice of bread.  1 six-inch tortilla.  3 oz (85 g) cooked rice or pasta.  4 oz (113 g) cooked dried beans.  4 oz (113 g) starchy vegetable, such as peas, corn, or potatoes.  4 oz (113 g) hot cereal.  4 oz (113 g) mashed potatoes or  of a large baked potato.  4 oz (113 g) canned or frozen fruit.  4 oz (120 mL) fruit juice.  4-6 crackers.  6 chicken nuggets.  6 oz (170 g) unsweetened dry cereal.  6 oz (170 g) plain fat-free yogurt or yogurt sweetened with  artificial sweeteners.  8 oz (240 mL) milk.  8 oz (170 g) fresh fruit or one small piece of fruit.  24 oz (680 g) popped popcorn. Example of carbohydrate counting Sample meal  3 oz (85 g) chicken breast.  6 oz (170 g) brown rice.  4 oz (113 g) corn.  8 oz (240 mL) milk.  8 oz (170 g) strawberries with sugar-free whipped topping. Carbohydrate calculation 1. Identify the foods that contain carbohydrates: ? Rice. ? Corn. ? Milk. ? Strawberries. 2. Calculate how many servings you have of each food: ? 2 servings rice. ? 1 serving corn. ? 1 serving milk. ? 1 serving strawberries. 3. Multiply each number of servings by 15 g: ? 2 servings rice x 15 g = 30 g. ? 1 serving corn x 15 g = 15 g. ? 1 serving milk x   15 g = 15 g. ? 1 serving strawberries x 15 g = 15 g. 4. Add together all of the amounts to find the total grams of carbohydrates eaten: ? 30 g + 15 g + 15 g + 15 g = 75 g of carbohydrates total. Summary  Carbohydrate counting is a method of keeping track of how many carbohydrates you eat.  Eating carbohydrates naturally increases the amount of sugar (glucose) in the blood.  Counting how many carbohydrates you eat helps keep your blood glucose within normal limits, which helps you manage your diabetes.  A diet and nutrition specialist (registered dietitian) can help you make a meal plan and calculate how many carbohydrates you should have at each meal and snack. This information is not intended to replace advice given to you by your health care provider. Make sure you discuss any questions you have with your health care provider. Document Revised: 11/27/2016 Document Reviewed: 10/17/2015 Elsevier Patient Education  2020 Elsevier Inc.  

## 2019-12-06 NOTE — Progress Notes (Signed)
Date:  12/06/2019   Name:  Brandon Green   DOB:  05-10-1971   MRN:  [id number]   Chief Complaint: Establish Care and Diabetes  Patient is a 49 year old male who presents for an establish exam. The patient reports the following problems: diabetes. Health maintenance has been reviewed up to date  Diabetes He presents for his follow-up diabetic visit. He has type 2 diabetes mellitus. His disease course has been stable. There are no hypoglycemic associated symptoms. Pertinent negatives for diabetes include no blurred vision, no chest pain, no fatigue, no foot paresthesias, no foot ulcerations, no polydipsia, no polyphagia, no polyuria, no visual change, no weakness and no weight loss. There are no hypoglycemic complications. Symptoms are stable. There are no diabetic complications.    Lab Results  Component Value Date   CREATININE 0.64 10/25/2019   BUN 11 10/25/2019   NA 136 10/25/2019   K 4.0 10/25/2019   CL 102 10/25/2019   CO2 25 10/25/2019   No results found for: CHOL, HDL, LDLCALC, LDLDIRECT, TRIG, CHOLHDL No results found for: TSH Lab Results  Component Value Date   HGBA1C 10.8 (H) 10/25/2019   No results found for: WBC, HGB, HCT, MCV, PLT No results found for: ALT, AST, GGT, ALKPHOS, BILITOT   Review of Systems  Constitutional: Negative for fatigue and weight loss.  Eyes: Negative for blurred vision.  Cardiovascular: Negative for chest pain.  Endocrine: Negative for polydipsia, polyphagia and polyuria.  Neurological: Negative for weakness.    Patient Active Problem List   Diagnosis Date Noted  . Plaque psoriasis 10/03/2019    Allergies  Allergen Reactions  . Percocet [Oxycodone-Acetaminophen] Itching    Past Surgical History:  Procedure Laterality Date  . FRACTURE SURGERY     right lower leg    Social History   Tobacco Use  . Smoking status: Current Every Day Smoker    Packs/day: 0.25    Types: Cigarettes  . Smokeless tobacco: Never Used  Vaping  Use  . Vaping Use: Never used  Substance Use Topics  . Alcohol use: Not Currently  . Drug use: Never     Medication list has been reviewed and updated.  Current Meds  Medication Sig  . Blood Glucose Monitoring Suppl (FREESTYLE FREEDOM) KIT 1 Units by Does not apply route in the morning and at bedtime.  . clobetasol cream (TEMOVATE) 0.05 % Apply topically.  Marland Kitchen etanercept (ENBREL) 50 MG/ML injection derm  . glucose blood test strip Check your sugar in the morning before you eat breakfast, and one hour after a meal.  . ibuprofen (ADVIL) 600 MG tablet Take 1 tablet (600 mg total) by mouth every 6 (six) hours as needed.  . metFORMIN (GLUCOPHAGE) 500 MG tablet Take 1 tablet (500 mg total) by mouth daily with breakfast. 1 tab po in the a.m. for 1 week, 1 tab twice a day for 1 week, then 1 tab in am and 2 tabs at night for the next week (Patient taking differently: Take 500 mg by mouth 2 (two) times daily with a meal. )    PHQ 2/9 Scores 12/06/2019  PHQ - 2 Score 0  PHQ- 9 Score 0    GAD 7 : Generalized Anxiety Score 12/06/2019  Nervous, Anxious, on Edge 0  Control/stop worrying 0  Worry too much - different things 0  Trouble relaxing 0  Restless 0  Easily annoyed or irritable 0  Afraid - awful might happen 0  Total GAD 7  Score 0    BP Readings from Last 3 Encounters:  12/06/19 130/80  10/25/19 137/87  09/30/19 131/84    Physical Exam Vitals and nursing note reviewed.  Constitutional:      Appearance: He is obese.  HENT:     Head: Normocephalic.     Right Ear: Tympanic membrane, ear canal and external ear normal.     Left Ear: Tympanic membrane, ear canal and external ear normal.     Nose: Nose normal.     Mouth/Throat:     Mouth: Mucous membranes are moist.  Eyes:     General: No scleral icterus.       Right eye: No discharge.        Left eye: No discharge.     Conjunctiva/sclera: Conjunctivae normal.     Pupils: Pupils are equal, round, and reactive to light.  Neck:       Thyroid: No thyromegaly.     Vascular: No JVD.     Trachea: No tracheal deviation.  Cardiovascular:     Rate and Rhythm: Normal rate and regular rhythm.     Heart sounds: Normal heart sounds, S1 normal and S2 normal. No murmur heard.  No systolic murmur is present.  No diastolic murmur is present.  No friction rub. No gallop. No S3 or S4 sounds.   Pulmonary:     Effort: No respiratory distress.     Breath sounds: Normal breath sounds. No wheezing or rales.  Abdominal:     General: Bowel sounds are normal.     Palpations: Abdomen is soft. There is no hepatomegaly, splenomegaly or mass.     Tenderness: There is no abdominal tenderness. There is no guarding or rebound.  Musculoskeletal:        General: No tenderness. Normal range of motion.     Cervical back: Normal range of motion and neck supple.     Right lower leg: No edema.     Left lower leg: No edema.  Lymphadenopathy:     Cervical: No cervical adenopathy.  Skin:    General: Skin is warm.     Findings: No rash.  Neurological:     Mental Status: He is alert and oriented to person, place, and time.     Cranial Nerves: No cranial nerve deficit.     Deep Tendon Reflexes: Reflexes are normal and symmetric.     Wt Readings from Last 3 Encounters:  12/06/19 276 lb (125.2 kg)  10/25/19 285 lb (129.3 kg)  09/30/19 290 lb (131.5 kg)    BP 130/80   Pulse 84   Ht 5\' 7"  (1.702 m)   Wt 276 lb (125.2 kg)   BMI 43.23 kg/m   Assessment and Plan:   1. Establishing care with new doctor, encounter for No subjective objective concerns noted during history and physical exam.  Review of previous encounters which are mostly at the urgent care for acute needs were reviewed.  Last visit is noted that patient is diabetic with an elevated glucose in the high 200s.  Our first step will be to check his A1c to see you what range we are and since he has been taking Metformin 500 mg twice a day and microalbuminuria to see if there is any  diabetic nephropathy in early stages and which we may need to add an ACE inhibitor.  Pending A1c will determine whether or not we continue with first category oral medications.  We will make a change on the Metformin  to the 750 XR 1 a day and will recheck the patient in 6 weeks.  Patient has also been given a diet sheet for counting calories.  2. Type 2 diabetes mellitus without complication, without long-term current use of insulin ([place]) As noted above her first steps are to change his Metformin to a 750 XR because of the diarrhea on the nighttime dosing of 1 g.  As noted also we are going to check an A1c and microalbuminuria and if patient does have positive albumin in the urine we will likely initiate an ACE inhibitor such as lisinopril 5 mg once a day.  Patient will return in 6 weeks at which time we will reassess.  Patient and his wife have been given a counting carbohydrate diet for which they can have more of an awareness of what dietary control is consistent. - Hemoglobin A1c - Microalbumin, urine - metFORMIN (GLUCOPHAGE-XR) 750 MG 24 hr tablet; Take 1 tablet (750 mg total) by mouth daily with breakfast.  Dispense: 30 tablet; Refill: 3  3. Adult BMI 40.0-44.9 kg/sq m [facility]) Health risks of being over weight were discussed and patient was counseled on weight loss options and exercise.  Carbohydrate dietary control has been discussed and diet sheet has been given.  4. Cigarette nicotine dependence without complication Patient has been advised of the health risks of smoking and counseled concerning cessation of tobacco products. I spent over 3 minutes for discussion and to answer questions.

## 2019-12-07 LAB — HEMOGLOBIN A1C
Est. average glucose Bld gHb Est-mCnc: 209 mg/dL
Hgb A1c MFr Bld: 8.9 % — ABNORMAL HIGH (ref 4.8–5.6)

## 2019-12-07 LAB — MICROALBUMIN, URINE: Microalbumin, Urine: 12.2 ug/mL

## 2019-12-08 ENCOUNTER — Other Ambulatory Visit: Payer: Self-pay

## 2019-12-08 DIAGNOSIS — E119 Type 2 diabetes mellitus without complications: Secondary | ICD-10-CM

## 2019-12-08 NOTE — Progress Notes (Unsigned)
Ref endo

## 2019-12-30 DIAGNOSIS — E1165 Type 2 diabetes mellitus with hyperglycemia: Secondary | ICD-10-CM | POA: Diagnosis not present

## 2020-01-17 ENCOUNTER — Ambulatory Visit: Payer: BC Managed Care – PPO | Admitting: Family Medicine

## 2020-01-17 ENCOUNTER — Other Ambulatory Visit: Payer: Self-pay

## 2020-01-17 ENCOUNTER — Encounter: Payer: Self-pay | Admitting: Family Medicine

## 2020-01-17 VITALS — BP 110/70 | HR 80 | Ht 67.0 in | Wt 277.0 lb

## 2020-01-17 DIAGNOSIS — N521 Erectile dysfunction due to diseases classified elsewhere: Secondary | ICD-10-CM | POA: Diagnosis not present

## 2020-01-17 DIAGNOSIS — E119 Type 2 diabetes mellitus without complications: Secondary | ICD-10-CM | POA: Diagnosis not present

## 2020-01-17 DIAGNOSIS — R809 Proteinuria, unspecified: Secondary | ICD-10-CM | POA: Diagnosis not present

## 2020-01-17 MED ORDER — SILDENAFIL CITRATE 100 MG PO TABS: 50.0000 mg | ORAL_TABLET | Freq: Every day | ORAL | 11 refills | Status: DC | PRN

## 2020-01-17 MED ORDER — LISINOPRIL 2.5 MG PO TABS: 2.5000 mg | ORAL_TABLET | Freq: Every day | ORAL | 1 refills | Status: DC

## 2020-01-17 NOTE — Progress Notes (Addendum)
Date:  01/17/2020   Name:  Brandon Green   DOB:  14-Sep-1970   MRN:  [id number]   Chief Complaint: Follow-up (has seen Warnell Forester on the 13th and was started on ozempic- has a follow up in sept with her)  Diabetes He presents for his follow-up diabetic visit. He has type 2 diabetes mellitus. His disease course has been stable. There are no hypoglycemic associated symptoms. Pertinent negatives for hypoglycemia include no dizziness, headaches or nervousness/anxiousness. There are no diabetic associated symptoms. Pertinent negatives for diabetes include no chest pain and no polydipsia. There are no hypoglycemic complications. Symptoms are stable. There are no diabetic complications. There are no known risk factors for coronary artery disease. Current diabetic treatment includes oral agent (monotherapy) (ozempic). His home blood glucose trend is fluctuating minimally. His breakfast blood glucose range is generally 140-180 mg/dl. An ACE inhibitor/angiotensin II receptor blocker is not being taken.  Erectile Dysfunction This is a chronic problem. The problem is unchanged. The nature of his difficulty is achieving erection and maintaining erection. He reports no anxiety. Irritative symptoms do not include frequency or urgency. Obstructive symptoms do not include a slower stream or a weak stream. Pertinent negatives include no chills, dysuria or hematuria. Past treatments include nothing.    Lab Results  Component Value Date   CREATININE 0.64 10/25/2019   BUN 11 10/25/2019   NA 136 10/25/2019   K 4.0 10/25/2019   CL 102 10/25/2019   CO2 25 10/25/2019   No results found for: CHOL, HDL, LDLCALC, LDLDIRECT, TRIG, CHOLHDL No results found for: TSH Lab Results  Component Value Date   HGBA1C 8.9 (H) 12/06/2019   No results found for: WBC, HGB, HCT, MCV, PLT No results found for: ALT, AST, GGT, ALKPHOS, BILITOT   Review of Systems  Constitutional: Negative for chills and fever.  HENT:  Negative for drooling, ear discharge, ear pain and sore throat.   Respiratory: Negative for cough, shortness of breath and wheezing.   Cardiovascular: Negative for chest pain, palpitations and leg swelling.  Gastrointestinal: Negative for abdominal pain, blood in stool, constipation, diarrhea and nausea.  Endocrine: Negative for polydipsia.  Genitourinary: Negative for dysuria, frequency, hematuria and urgency.  Musculoskeletal: Negative for back pain, myalgias and neck pain.  Skin: Negative for rash.  Allergic/Immunologic: Negative for environmental allergies.  Neurological: Negative for dizziness and headaches.  Hematological: Does not bruise/bleed easily.  Psychiatric/Behavioral: Negative for suicidal ideas. The patient is not nervous/anxious.     Patient Active Problem List   Diagnosis Date Noted  . Plaque psoriasis 10/03/2019    Allergies  Allergen Reactions  . Percocet [Oxycodone-Acetaminophen] Itching    Past Surgical History:  Procedure Laterality Date  . FRACTURE SURGERY     right lower leg    Social History   Tobacco Use  . Smoking status: Current Every Day Smoker    Packs/day: 0.25    Types: Cigarettes  . Smokeless tobacco: Never Used  Vaping Use  . Vaping Use: Never used  Substance Use Topics  . Alcohol use: Not Currently  . Drug use: Never     Medication list has been reviewed and updated.  Current Meds  Medication Sig  . Blood Glucose Monitoring Suppl (FREESTYLE FREEDOM) KIT 1 Units by Does not apply route in the morning and at bedtime.  . clobetasol cream (TEMOVATE) 0.05 % Apply topically.  Marland Kitchen etanercept (ENBREL) 50 MG/ML injection derm  . glucose blood test strip Check your sugar in the morning  before you eat breakfast, and one hour after a meal.  . ibuprofen (ADVIL) 600 MG tablet Take 1 tablet (600 mg total) by mouth every 6 (six) hours as needed.  . metFORMIN (GLUCOPHAGE-XR) 750 MG 24 hr tablet Take 1 tablet (750 mg total) by mouth daily with  breakfast.  . Semaglutide,0.25 or 0.5MG/DOS, 2 MG/1.5ML SOPN endo    PHQ 2/9 Scores 01/17/2020 12/06/2019  PHQ - 2 Score 0 0  PHQ- 9 Score 0 0    GAD 7 : Generalized Anxiety Score 01/17/2020 12/06/2019  Nervous, Anxious, on Edge 0 0  Control/stop worrying 0 0  Worry too much - different things 0 0  Trouble relaxing 0 0  Restless 0 0  Easily annoyed or irritable 0 0  Afraid - awful might happen 0 0  Total GAD 7 Score 0 0    BP Readings from Last 3 Encounters:  01/17/20 110/70  12/06/19 130/80  10/25/19 137/87    Physical Exam Vitals and nursing note reviewed.  HENT:     Head: Normocephalic.     Right Ear: External ear normal.     Left Ear: External ear normal.     Nose: Nose normal.  Eyes:     General: No scleral icterus.       Right eye: No discharge.        Left eye: No discharge.     Conjunctiva/sclera: Conjunctivae normal.     Pupils: Pupils are equal, round, and reactive to light.  Neck:     Thyroid: No thyromegaly.     Vascular: No JVD.     Trachea: No tracheal deviation.  Cardiovascular:     Rate and Rhythm: Normal rate and regular rhythm.     Heart sounds: Normal heart sounds. No murmur heard.  No friction rub. No gallop.   Pulmonary:     Effort: No respiratory distress.     Breath sounds: Normal breath sounds. No wheezing or rales.  Abdominal:     General: Bowel sounds are normal.     Palpations: Abdomen is soft. There is no mass.     Tenderness: There is no abdominal tenderness. There is no guarding or rebound.  Musculoskeletal:        General: No tenderness. Normal range of motion.     Cervical back: Normal range of motion and neck supple.  Lymphadenopathy:     Cervical: No cervical adenopathy.  Skin:    General: Skin is warm.     Findings: No rash.  Neurological:     Mental Status: He is alert and oriented to person, place, and time.     Cranial Nerves: No cranial nerve deficit.     Deep Tendon Reflexes: Reflexes are normal and symmetric.      Wt Readings from Last 3 Encounters:  01/17/20 277 lb (125.6 kg)  12/06/19 276 lb (125.2 kg)  10/25/19 285 lb (129.3 kg)    BP 110/70   Pulse 80   Ht '5\' 7"'  (1.702 m)   Wt 277 lb (125.6 kg)   BMI 43.38 kg/m   Assessment and Plan: 1. Type 2 diabetes mellitus without complication, without long-term current use of insulin (HCC) Chronic.  Controlled.  Stable.  Patient is reaching control with his blood sugars in the 160 range on the combination of Metformin 750 XL plus Ozempic.  He will be going to the next dosing of Ozempic in the not too distant future in the meantime always been tolerated well.  Patient  had a relatively large lunch and we will check the patient's lipid panel and if there is significant elevation of LDL we will likely start atorvastatin. - Lipid Panel With LDL/HDL Ratio - lisinopril (ZESTRIL) 2.5 MG tablet; Take 1 tablet (2.5 mg total) by mouth daily.  Dispense: 90 tablet; Refill: 1  2. Positive for microalbuminuria Noted to have some microalbuminuria and with patient's blood pressure is doing well we will put him on a very low-dose of lisinopril 2.5 mg for ACE inhibitor protection for nephrotoxicity from diabetes. - lisinopril (ZESTRIL) 2.5 MG tablet; Take 1 tablet (2.5 mg total) by mouth daily.  Dispense: 90 tablet; Refill: 1  3. Erectile dysfunction due to diseases classified elsewhere Patient is having some issues with erectile dysfunction both with achieving erection and maintaining.  We will prescribe 100 mg sildenafil and go with 1/2 tablet as needed.

## 2020-01-18 LAB — LIPID PANEL WITH LDL/HDL RATIO
Cholesterol, Total: 165 mg/dL (ref 100–199)
HDL: 38 mg/dL — ABNORMAL LOW (ref >39)
LDL Chol Calc (NIH): 102 mg/dL — ABNORMAL HIGH (ref 0–99)
LDL/HDL Ratio: 2.7 ratio (ref 0.0–3.6)
Triglycerides: 139 mg/dL (ref 0–149)
VLDL Cholesterol Cal: 25 mg/dL (ref 5–40)

## 2020-02-10 DIAGNOSIS — E1165 Type 2 diabetes mellitus with hyperglycemia: Secondary | ICD-10-CM | POA: Diagnosis not present

## 2020-02-27 DIAGNOSIS — Z79899 Other long term (current) drug therapy: Secondary | ICD-10-CM | POA: Diagnosis not present

## 2020-02-27 DIAGNOSIS — L400 Psoriasis vulgaris: Secondary | ICD-10-CM | POA: Diagnosis not present

## 2020-04-16 ENCOUNTER — Telehealth: Payer: Self-pay

## 2020-04-16 NOTE — Telephone Encounter (Unsigned)
Copied from CRM [id number]. Topic: Appointment Scheduling - Scheduling Inquiry for Clinic >> Apr 16, 2020  2:13 PM Daphine Deutscher D wrote: Reason for CRM: Pt called saying he had surgery about 6 years ago with Dr. Ether Griffins.  It is on his ankle and he has notice there is a new lump coming up at the surgery site and would like to be seen to get an xray.  CB# [phone]

## 2020-04-23 ENCOUNTER — Ambulatory Visit: Payer: BC Managed Care – PPO | Admitting: Family Medicine

## 2020-04-23 ENCOUNTER — Other Ambulatory Visit: Payer: Self-pay

## 2020-04-23 ENCOUNTER — Ambulatory Visit
Admission: RE | Admit: 2020-04-23 | Discharge: 2020-04-23 | Disposition: A | Discharge status: Home / Self Care | Payer: BC Managed Care – PPO | Source: Ambulatory Visit | Attending: Family Medicine | Admitting: Family Medicine

## 2020-04-23 ENCOUNTER — Ambulatory Visit
Admission: RE | Admit: 2020-04-23 | Discharge: 2020-04-23 | Disposition: A | Discharge status: Home / Self Care | Payer: BC Managed Care – PPO | Attending: Family Medicine | Admitting: Family Medicine

## 2020-04-23 ENCOUNTER — Encounter: Payer: Self-pay | Admitting: Family Medicine

## 2020-04-23 VITALS — BP 130/70 | HR 80 | Ht 67.0 in | Wt 274.0 lb

## 2020-04-23 DIAGNOSIS — M25571 Pain in right ankle and joints of right foot: Secondary | ICD-10-CM

## 2020-04-23 DIAGNOSIS — Z8781 Personal history of (healed) traumatic fracture: Secondary | ICD-10-CM | POA: Diagnosis not present

## 2020-04-23 NOTE — Progress Notes (Signed)
Date:  04/23/2020   Name:  Brandon Green   DOB:  May 28, 1970   MRN:  [id number]   Chief Complaint: knot above ankle (R) ankle- had surgery about 6 yrs ago- concerned "something came loose")  Ankle Pain  The incident occurred 5 to 7 days ago. The injury mechanism was a twisting injury (2011). The pain is present in the right ankle. The quality of the pain is described as aching. The pain is at a severity of 6/10. The pain is moderate. The pain has been intermittent since onset. Associated symptoms comments: Tender to palpation. It is unknown if a foreign body is present. The symptoms are aggravated by palpation. The treatment provided no relief.    Lab Results  Component Value Date   CREATININE 0.64 10/25/2019   BUN 11 10/25/2019   NA 136 10/25/2019   K 4.0 10/25/2019   CL 102 10/25/2019   CO2 25 10/25/2019   Lab Results  Component Value Date   CHOL 165 01/17/2020   HDL 38 (L) 01/17/2020   LDLCALC 102 (H) 01/17/2020   TRIG 139 01/17/2020   No results found for: TSH Lab Results  Component Value Date   HGBA1C 8.9 (H) 12/06/2019   No results found for: WBC, HGB, HCT, MCV, PLT No results found for: ALT, AST, GGT, ALKPHOS, BILITOT   Review of Systems  Constitutional: Negative for chills and fever.  HENT: Negative for drooling, ear discharge, ear pain and sore throat.   Respiratory: Negative for cough, shortness of breath and wheezing.   Cardiovascular: Negative for chest pain, palpitations and leg swelling.  Gastrointestinal: Negative for abdominal pain, blood in stool, constipation, diarrhea and nausea.  Endocrine: Negative for polydipsia.  Genitourinary: Negative for dysuria, frequency, hematuria and urgency.  Musculoskeletal: Negative for back pain, joint swelling, myalgias and neck pain.  Skin: Negative for rash.  Allergic/Immunologic: Negative for environmental allergies.  Neurological: Negative for dizziness and headaches.  Hematological: Does not bruise/bleed  easily.  Psychiatric/Behavioral: Negative for suicidal ideas. The patient is not nervous/anxious.     Patient Active Problem List   Diagnosis Date Noted  . Plaque psoriasis 10/03/2019    Allergies  Allergen Reactions  . Percocet [Oxycodone-Acetaminophen] Itching    Past Surgical History:  Procedure Laterality Date  . FRACTURE SURGERY     right lower leg    Social History   Tobacco Use  . Smoking status: Current Every Day Smoker    Packs/day: 0.25    Types: Cigarettes  . Smokeless tobacco: Never Used  Vaping Use  . Vaping Use: Never used  Substance Use Topics  . Alcohol use: Not Currently  . Drug use: Never     Medication list has been reviewed and updated.  Current Meds  Medication Sig  . Blood Glucose Monitoring Suppl (FREESTYLE FREEDOM) KIT 1 Units by Does not apply route in the morning and at bedtime.  . clobetasol cream (TEMOVATE) 0.05 % Apply topically.  Marland Kitchen etanercept (ENBREL) 50 MG/ML injection derm  . glucose blood test strip Check your sugar in the morning before you eat breakfast, and one hour after a meal.  . ibuprofen (ADVIL) 600 MG tablet Take 1 tablet (600 mg total) by mouth every 6 (six) hours as needed.  Marland Kitchen lisinopril (ZESTRIL) 2.5 MG tablet Take 1 tablet (2.5 mg total) by mouth daily.  . metFORMIN (GLUCOPHAGE-XR) 750 MG 24 hr tablet Take 1 tablet (750 mg total) by mouth daily with breakfast.  . Semaglutide,0.25 or 0.5MG/DOS, 2  MG/1.5ML SOPN endo  . sildenafil (VIAGRA) 100 MG tablet Take 0.5-1 tablets (50-100 mg total) by mouth daily as needed for erectile dysfunction.    PHQ 2/9 Scores 01/17/2020 12/06/2019  PHQ - 2 Score 0 0  PHQ- 9 Score 0 0    GAD 7 : Generalized Anxiety Score 01/17/2020 12/06/2019  Nervous, Anxious, on Edge 0 0  Control/stop worrying 0 0  Worry too much - different things 0 0  Trouble relaxing 0 0  Restless 0 0  Easily annoyed or irritable 0 0  Afraid - awful might happen 0 0  Total GAD 7 Score 0 0    BP Readings from  Last 3 Encounters:  04/23/20 130/70  01/17/20 110/70  12/06/19 130/80    Physical Exam Vitals and nursing note reviewed.  HENT:     Head: Normocephalic.     Right Ear: Tympanic membrane, ear canal and external ear normal. There is no impacted cerumen.     Left Ear: Tympanic membrane, ear canal and external ear normal. There is no impacted cerumen.     Nose: Nose normal.  Eyes:     General: No scleral icterus.       Right eye: No discharge.        Left eye: No discharge.     Conjunctiva/sclera: Conjunctivae normal.     Pupils: Pupils are equal, round, and reactive to light.  Neck:     Thyroid: No thyromegaly.     Vascular: No JVD.     Trachea: No tracheal deviation.  Cardiovascular:     Rate and Rhythm: Normal rate and regular rhythm.     Heart sounds: Normal heart sounds. No murmur heard.  No friction rub. No gallop.   Pulmonary:     Effort: No respiratory distress.     Breath sounds: Normal breath sounds. No wheezing, rhonchi or rales.  Abdominal:     General: Bowel sounds are normal.     Palpations: Abdomen is soft. There is no mass.     Tenderness: There is no abdominal tenderness. There is no guarding or rebound.  Musculoskeletal:        General: Normal range of motion.     Cervical back: Normal range of motion and neck supple.     Right ankle: Deformity present. Tenderness present. Normal range of motion.     Comments: Tender over deformity  Lymphadenopathy:     Cervical: No cervical adenopathy.  Skin:    General: Skin is warm.     Findings: No rash.  Neurological:     Mental Status: He is alert and oriented to person, place, and time.     Cranial Nerves: No cranial nerve deficit.     Deep Tendon Reflexes: Reflexes are normal and symmetric.     Wt Readings from Last 3 Encounters:  04/23/20 274 lb (124.3 kg)  01/17/20 277 lb (125.6 kg)  12/06/19 276 lb (125.2 kg)    BP 130/70   Pulse 80   Ht _0  (1.702 m)   Wt 274 lb (124.3 kg)   BMI 42.91 kg/m    Assessment and Plan:   1. Right ankle pain, unspecified chronicity New onset of renewed pain over an area of palpable knot.  I have suspicion that this may be a malaligned screw that may have backed out.  Patient also has tenderness over the distal fibula.  We will refer to Dr. Otelia Limes podiatry who did his original surgery in 2011.  We will  obtain an x-ray to make sure there is no refracture or instability of previous surgery. - Ambulatory referral to Podiatry - DG Ankle Complete Right; Future  2. History of fibula fracture Patient has a history of previous surgery in 2011 per Dr. Vickki Muff for stabilization of a multiple segmental fracture of the right fibula.  Patient is done well until within the past week patient noted a knot over the fibula with tenderness over the area as well.  As noted above we will x-ray and refer to podiatry.

## 2020-04-24 ENCOUNTER — Telehealth: Payer: Self-pay | Admitting: Family Medicine

## 2020-04-24 NOTE — Telephone Encounter (Signed)
Patient is calling again regarding his x-ray results.  He stated that the doctor told him to call this afternoon to get results.  CB# [phone]

## 2020-04-25 ENCOUNTER — Other Ambulatory Visit: Payer: Self-pay

## 2020-04-25 DIAGNOSIS — Z8781 Personal history of (healed) traumatic fracture: Secondary | ICD-10-CM

## 2020-04-25 DIAGNOSIS — M25571 Pain in right ankle and joints of right foot: Secondary | ICD-10-CM

## 2020-05-08 DIAGNOSIS — E1165 Type 2 diabetes mellitus with hyperglycemia: Secondary | ICD-10-CM | POA: Diagnosis not present

## 2020-05-11 ENCOUNTER — Other Ambulatory Visit: Payer: Self-pay

## 2020-05-11 ENCOUNTER — Encounter: Payer: Self-pay | Admitting: Emergency Medicine

## 2020-05-11 ENCOUNTER — Ambulatory Visit
Admission: EM | Admit: 2020-05-11 | Discharge: 2020-05-11 | Disposition: A | Discharge status: Home / Self Care | Payer: BC Managed Care – PPO | Attending: Physician Assistant | Admitting: Physician Assistant

## 2020-05-11 DIAGNOSIS — Z20822 Contact with and (suspected) exposure to covid-19: Secondary | ICD-10-CM | POA: Diagnosis not present

## 2020-05-11 LAB — RESP PANEL BY RT-PCR (FLU A&B, COVID) ARPGX2
Influenza A by PCR: NEGATIVE
Influenza B by PCR: NEGATIVE
SARS Coronavirus 2 by RT PCR: NEGATIVE

## 2020-05-11 NOTE — Discharge Instructions (Signed)

## 2020-05-11 NOTE — ED Triage Notes (Signed)
Patient states that his wife tested positive for covid yesterday.  Patient denies any symptoms.  Patient still wants to see the provider today.

## 2020-05-11 NOTE — ED Provider Notes (Signed)
[facility]    CSN: [id number] Arrival date & time: 05/11/20  1211      History   Chief Complaint Chief Complaint  Patient presents with   Covid Exposure    No symptoms    HPI Brandon Green is a 49 y.o. male presenting with son for positive Covid exposure through his wife.  Patient states that his wife tested positive for Covid yesterday with an at home test.  He denies any symptoms, but would like to be checked out today.  Patient denies any fever, fatigue, body aches, headaches, cough, sore throat, congestion, chest discomfort, breathing difficulty, abdominal pain, nausea/vomiting/diarrhea, or change in smell or taste.  Patient has not been vaccinated for COVID-19.  He has a history of diabetes and psoriasis.  No other complaints or concerns at this time.  HPI  Past Medical History:  Diagnosis Date   Diabetes mellitus without complication ([place])    Psoriasis     Patient Active Problem List   Diagnosis Date Noted   Plaque psoriasis 10/03/2019    Past Surgical History:  Procedure Laterality Date   FRACTURE SURGERY     right lower leg       Home Medications    Prior to Admission medications   Medication Sig Start Date End Date Taking? Authorizing Provider  etanercept (ENBREL) 50 MG/ML injection derm 11/24/19  Yes [provider]  ibuprofen (ADVIL) 600 MG tablet Take 1 tablet (600 mg total) by mouth every 6 (six) hours as needed. 04/16/19  Yes Melynda Ripple, MD  lisinopril (ZESTRIL) 2.5 MG tablet Take 1 tablet (2.5 mg total) by mouth daily. 01/17/20  Yes Juline Patch, MD  metFORMIN (GLUCOPHAGE-XR) 750 MG 24 hr tablet Take 1 tablet (750 mg total) by mouth daily with breakfast. 12/06/19  Yes Juline Patch, MD  Semaglutide,0.25 or 0.5MG/DOS, 2 MG/1.5ML SOPN endo 12/30/19  Yes [provider]  Blood Glucose Monitoring Suppl (FREESTYLE FREEDOM) KIT 1 Units by Does not apply route in the morning and at bedtime. 10/25/19   Melynda Ripple, MD  clobetasol cream (TEMOVATE) 0.05 % Apply topically. 10/03/19 10/02/20  [provider]  glucose blood test strip Check your sugar in the morning before you eat breakfast, and one hour after a meal. 10/25/19   Melynda Ripple, MD  sildenafil (VIAGRA) 100 MG tablet Take 0.5-1 tablets (50-100 mg total) by mouth daily as needed for erectile dysfunction. 01/17/20   Juline Patch, MD    Family History Family History  Problem Relation Age of Onset   Diabetes Mother    Diabetes Brother    Cancer Maternal Aunt    Heart disease Maternal Uncle    Stroke Maternal Uncle    Cancer Maternal Grandfather    Heart disease Maternal Grandfather    Hypertension Maternal Grandfather     Social History Social History   Tobacco Use   Smoking status: Current Every Day Smoker    Packs/day: 0.25    Types: Cigarettes   Smokeless tobacco: Never Used  Vaping Use   Vaping Use: Never used  Substance Use Topics   Alcohol use: Not Currently   Drug use: Never     Allergies   Percocet [oxycodone-acetaminophen]   Review of Systems Review of Systems  Constitutional: Negative for fatigue and fever.  HENT: Negative for congestion, rhinorrhea, sinus pressure, sinus pain and sore throat.   Respiratory: Negative for cough and shortness of breath.   Gastrointestinal: Negative for abdominal pain, diarrhea, nausea  and vomiting.  Musculoskeletal: Negative for myalgias.  Neurological: Negative for weakness, light-headedness and headaches.  Hematological: Negative for adenopathy.     Physical Exam Triage Vital Signs ED Triage Vitals  Enc Vitals Group     BP 05/11/20 1305 109/63     Pulse Rate 05/11/20 1305 81     Resp 05/11/20 1305 18     Temp 05/11/20 1305 98.1 F (36.7 C)     Temp Source 05/11/20 1305 Oral     SpO2 05/11/20 1305 96 %     Weight 05/11/20 1302 275 lb (124.7 kg)     Height 05/11/20 1302 _0  (1.702 m)     Head Circumference --      Peak Flow --       Pain Score 05/11/20 1302 0     Pain Loc --      Pain Edu? --      Excl. in [place]? --    No data found.  Updated Vital Signs BP 109/63 (BP Location: Left Arm)    Pulse 81    Temp 98.1 F (36.7 C) (Oral)    Resp 18    Ht _1  (1.702 m)    Wt 275 lb (124.7 kg)    SpO2 96%    BMI 43.07 kg/m       Physical Exam Vitals and nursing note reviewed.  Constitutional:      General: He is not in acute distress.    Appearance: Normal appearance. He is well-developed and well-nourished. He is not ill-appearing or diaphoretic.  HENT:     Head: Normocephalic and atraumatic.     Mouth/Throat:     Mouth: Oropharynx is clear and moist and mucous membranes are normal.     Pharynx: Uvula midline. No oropharyngeal exudate.     Tonsils: No tonsillar abscesses.  Eyes:     General: No scleral icterus.       Right eye: No discharge.        Left eye: No discharge.     Extraocular Movements: EOM normal.     Conjunctiva/sclera: Conjunctivae normal.  Neck:     Thyroid: No thyromegaly.     Trachea: No tracheal deviation.  Cardiovascular:     Rate and Rhythm: Normal rate and regular rhythm.     Heart sounds: Normal heart sounds.  Pulmonary:     Effort: Pulmonary effort is normal. No respiratory distress.     Breath sounds: Normal breath sounds. No wheezing, rhonchi or rales.  Musculoskeletal:     Cervical back: Normal range of motion and neck supple.  Skin:    General: Skin is warm and dry.     Findings: No rash.  Neurological:     General: No focal deficit present.     Mental Status: He is alert. Mental status is at baseline.     Motor: No weakness.     Gait: Gait normal.  Psychiatric:        Mood and Affect: Mood normal.        Behavior: Behavior normal.        Thought Content: Thought content normal.      UC Treatments / Results  Labs (all labs ordered are listed, but only abnormal results are displayed) Labs Reviewed  RESP PANEL BY RT-PCR (FLU A&B, COVID) [id number]     EKG   Radiology No results found.  Procedures Procedures (including critical care time)  Medications Ordered in UC Medications - No data to display  Initial Impression / Assessment and Plan / UC Course  I have reviewed the triage vital signs and the nursing notes.  Pertinent labs & imaging results that were available during my care of the patient were reviewed by me and considered in my medical decision making (see chart for details).   Asymptomatic patient with possible Covid exposure presenting for Covid testing today.  Negative respiratory panel.  CDC guidelines, isolation protocol and ED precautions discussed with patient.  Advised getting other tested positive since he is high risk for COVID-19 and is not vaccinated.  Final Clinical Impressions(s) / UC Diagnoses   Final diagnoses:  Exposure to COVID-19 virus     Discharge Instructions     You have received COVID testing today either for positive exposure, concerning symptoms that could be related to COVID infection, screening purposes, or re-testing after confirmed positive.  Your test obtained today checks for active viral infection in the last 1-2 weeks. If your test is negative now, you can still test positive later. So, if you do develop symptoms you should either get re-tested and/or isolate x 10 days. Please follow CDC guidelines.  While Rapid antigen tests come back in 15-20 minutes, send out PCR/molecular test results typically come back within 24 hours. In the mean time, if you are symptomatic, assume this could be a positive test and treat/monitor yourself as if you do have COVID.   We will call with test results. Please download the MyChart app and set up a profile to access test results.   If symptomatic, go home and rest. Push fluids. Take Tylenol as needed for discomfort. Gargle warm salt water. Throat lozenges. Take Mucinex DM or Robitussin for cough. Humidifier in bedroom to ease coughing. Warm showers.  Also review the COVID handout for more information.  COVID-19 INFECTION: The incubation period of COVID-19 is approximately 14 days after exposure, with most symptoms developing in roughly 4-5 days. Symptoms may range in severity from mild to critically severe. Roughly 80% of those infected will have mild symptoms. People of any age may become infected with COVID-19 and have the ability to transmit the virus. The most common symptoms include: fever, fatigue, cough, body aches, headaches, sore throat, nasal congestion, shortness of breath, nausea, vomiting, diarrhea, changes in smell and/or taste.    COURSE OF ILLNESS Some patients may begin with mild disease which can progress quickly into critical symptoms. If your symptoms are worsening please call ahead to the Emergency Department and proceed there for further treatment. Recovery time appears to be roughly 1-2 weeks for mild symptoms and 3-6 weeks for severe disease.   GO IMMEDIATELY TO ER FOR FEVER YOU ARE UNABLE TO GET DOWN WITH TYLENOL, BREATHING PROBLEMS, CHEST PAIN, FATIGUE, LETHARGY, INABILITY TO EAT OR DRINK, ETC  QUARANTINE AND ISOLATION: To help decrease the spread of COVID-19 please remain isolated if you have COVID infection or are highly suspected to have COVID infection. This means -stay home and isolate to one room in the home if you live with others. Do not share a bed or bathroom with others while ill, sanitize and wipe down all countertops and keep common areas clean and disinfected. You may discontinue isolation if you have a mild case and are asymptomatic 10 days after symptom onset as long as you have been fever free >24 hours without having to take Motrin or Tylenol. If your case is more severe (meaning you develop pneumonia or are admitted in the hospital), you may have to isolate longer.  If you have been in close contact (within 6 feet) of someone diagnosed with COVID 19, you are advised to quarantine in your home for 14 days as  symptoms can develop anywhere from 2-14 days after exposure to the virus. If you develop symptoms, you  must isolate.  Most current guidelines for COVID after exposure -isolate 10 days if you ARE NOT tested for COVID as long as symptoms do not develop -isolate 7 days if you are tested and remain asymptomatic -You do not necessarily need to be tested for COVID if you have + exposure and        develop   symptoms. Just isolate at home x10 days from symptom onset During this global pandemic, CDC advises to practice social distancing, try to stay at least 62f away from others at all times. Wear a face covering. Wash and sanitize your hands regularly and avoid going anywhere that is not necessary.  KEEP IN MIND THAT THE COVID TEST IS NOT 100% ACCURATE AND YOU SHOULD STILL DO EVERYTHING TO PREVENT POTENTIAL SPREAD OF VIRUS TO OTHERS (WEAR MASK, WEAR GLOVES, WButlerHANDS AND SANITIZE REGULARLY). IF INITIAL TEST IS NEGATIVE, THIS MAY NOT MEAN YOU ARE DEFINITELY NEGATIVE. MOST ACCURATE TESTING IS DONE 5-7 DAYS AFTER EXPOSURE.   It is not advised by CDC to get re-tested after receiving a positive COVID test since you can still test positive for weeks to months after you have already cleared the virus.   *If you have not been vaccinated for COVID, I strongly suggest you consider getting vaccinated as long as there are no contraindications.      ED Prescriptions    None     PDMP not reviewed this encounter.   EDanton Clap PA-C 05/11/20 1400

## 2020-05-25 ENCOUNTER — Other Ambulatory Visit: Payer: Self-pay

## 2020-05-25 ENCOUNTER — Encounter: Payer: Self-pay | Admitting: Family Medicine

## 2020-05-25 ENCOUNTER — Ambulatory Visit: Payer: BC Managed Care – PPO | Admitting: Family Medicine

## 2020-05-25 VITALS — BP 132/80 | HR 60 | Ht 67.0 in | Wt 277.0 lb

## 2020-05-25 DIAGNOSIS — Z1330 Encounter for screening examination for mental health and behavioral disorders, unspecified: Secondary | ICD-10-CM

## 2020-05-25 DIAGNOSIS — E119 Type 2 diabetes mellitus without complications: Secondary | ICD-10-CM

## 2020-05-25 DIAGNOSIS — N521 Erectile dysfunction due to diseases classified elsewhere: Secondary | ICD-10-CM

## 2020-05-25 DIAGNOSIS — E7801 Familial hypercholesterolemia: Secondary | ICD-10-CM | POA: Diagnosis not present

## 2020-05-25 DIAGNOSIS — R809 Proteinuria, unspecified: Secondary | ICD-10-CM

## 2020-05-25 DIAGNOSIS — F17210 Nicotine dependence, cigarettes, uncomplicated: Secondary | ICD-10-CM

## 2020-05-25 MED ORDER — LISINOPRIL 2.5 MG PO TABS: 2.5000 mg | ORAL_TABLET | Freq: Every day | ORAL | 1 refills | Status: DC

## 2020-05-25 MED ORDER — ATORVASTATIN CALCIUM 10 MG PO TABS: 10.0000 mg | ORAL_TABLET | Freq: Every day | ORAL | 1 refills | Status: DC

## 2020-05-25 MED ORDER — SILDENAFIL CITRATE 100 MG PO TABS: 50.0000 mg | ORAL_TABLET | Freq: Every day | ORAL | 11 refills | Status: DC | PRN

## 2020-05-25 NOTE — Progress Notes (Signed)
Date:  05/25/2020   Name:  Brandon Green   DOB:  July 29, 1970   MRN:  [id number]   Chief Complaint: Follow-up (Is in with [facility] Hippo)  Diabetes He presents for his follow-up diabetic visit. He has type 2 diabetes mellitus. His disease course has been stable. There are no hypoglycemic associated symptoms. Pertinent negatives for hypoglycemia include no dizziness, headaches or nervousness/anxiousness. There are no diabetic associated symptoms. Pertinent negatives for diabetes include no blurred vision, no chest pain, no fatigue, no foot paresthesias, no foot ulcerations, no polydipsia, no polyphagia, no polyuria, no visual change, no weakness and no weight loss. There are no hypoglycemic complications. Symptoms are stable. There are no diabetic complications. Risk factors for coronary artery disease include dyslipidemia. Current diabetic treatments: ozempic. He is following a generally healthy diet. Meal planning includes avoidance of concentrated sweets and carbohydrate counting. He participates in exercise daily. His breakfast blood glucose is taken between 8-9 am. His breakfast blood glucose range is generally 130-140 mg/dl. An ACE inhibitor/angiotensin II receptor blocker is being taken.  Hyperlipidemia This is a chronic problem. The current episode started more than 1 year ago. The problem is uncontrolled. Recent lipid tests were reviewed and are variable. Exacerbating diseases include diabetes and obesity. Pertinent negatives include no chest pain, myalgias or shortness of breath. Current antihyperlipidemic treatment includes diet change.    Lab Results  Component Value Date   CREATININE 0.64 10/25/2019   BUN 11 10/25/2019   NA 136 10/25/2019   K 4.0 10/25/2019   CL 102 10/25/2019   CO2 25 10/25/2019   Lab Results  Component Value Date   CHOL 165 01/17/2020   HDL 38 (L) 01/17/2020   LDLCALC 102 (H) 01/17/2020   TRIG 139 01/17/2020   No results found for: TSH Lab Results   Component Value Date   HGBA1C 8.9 (H) 12/06/2019   No results found for: WBC, HGB, HCT, MCV, PLT No results found for: ALT, AST, GGT, ALKPHOS, BILITOT   Review of Systems  Constitutional: Negative for chills, fatigue, fever and weight loss.  HENT: Negative for drooling, ear discharge, ear pain and sore throat.   Eyes: Negative for blurred vision.  Respiratory: Negative for cough, shortness of breath and wheezing.   Cardiovascular: Negative for chest pain, palpitations and leg swelling.  Gastrointestinal: Negative for abdominal pain, blood in stool, constipation, diarrhea and nausea.  Endocrine: Negative for polydipsia, polyphagia and polyuria.  Genitourinary: Negative for dysuria, frequency, hematuria and urgency.  Musculoskeletal: Negative for back pain, myalgias and neck pain.  Skin: Negative for rash.  Allergic/Immunologic: Negative for environmental allergies.  Neurological: Negative for dizziness, weakness and headaches.  Hematological: Does not bruise/bleed easily.  Psychiatric/Behavioral: Negative for suicidal ideas. The patient is not nervous/anxious.     Patient Active Problem List   Diagnosis Date Noted  . Plaque psoriasis 10/03/2019    Allergies  Allergen Reactions  . Percocet [Oxycodone-Acetaminophen] Itching    Past Surgical History:  Procedure Laterality Date  . FRACTURE SURGERY     right lower leg    Social History   Tobacco Use  . Smoking status: Current Every Day Smoker    Packs/day: 0.25    Types: Cigarettes  . Smokeless tobacco: Never Used  Vaping Use  . Vaping Use: Never used  Substance Use Topics  . Alcohol use: Not Currently  . Drug use: Never     Medication list has been reviewed and updated.  Current Meds  Medication Sig  .  Blood Glucose Monitoring Suppl (FREESTYLE FREEDOM) KIT 1 Units by Does not apply route in the morning and at bedtime.  . clobetasol cream (TEMOVATE) 0.05 % Apply topically.  Marland Kitchen etanercept (ENBREL) 50 MG/ML  injection derm  . glucose blood test strip Check your sugar in the morning before you eat breakfast, and one hour after a meal.  . ibuprofen (ADVIL) 600 MG tablet Take 1 tablet (600 mg total) by mouth every 6 (six) hours as needed.  Marland Kitchen lisinopril (ZESTRIL) 2.5 MG tablet Take 1 tablet (2.5 mg total) by mouth daily.  . metFORMIN (GLUCOPHAGE-XR) 750 MG 24 hr tablet Take 1 tablet (750 mg total) by mouth daily with breakfast.  . sildenafil (VIAGRA) 100 MG tablet Take 0.5-1 tablets (50-100 mg total) by mouth daily as needed for erectile dysfunction.    PHQ 2/9 Scores 01/17/2020 12/06/2019  PHQ - 2 Score 0 0  PHQ- 9 Score 0 0    GAD 7 : Generalized Anxiety Score 01/17/2020 12/06/2019  Nervous, Anxious, on Edge 0 0  Control/stop worrying 0 0  Worry too much - different things 0 0  Trouble relaxing 0 0  Restless 0 0  Easily annoyed or irritable 0 0  Afraid - awful might happen 0 0  Total GAD 7 Score 0 0    BP Readings from Last 3 Encounters:  05/25/20 132/80  05/11/20 109/63  04/23/20 130/70    Physical Exam Vitals and nursing note reviewed.  HENT:     Head: Normocephalic.     Right Ear: Tympanic membrane, ear canal and external ear normal.     Left Ear: Tympanic membrane, ear canal and external ear normal.     Nose: Nose normal. No congestion or rhinorrhea.     Mouth/Throat:     Mouth: Oropharynx is clear and moist. Mucous membranes are moist.     Pharynx: No oropharyngeal exudate or posterior oropharyngeal erythema.  Eyes:     General: No scleral icterus.       Right eye: No discharge.        Left eye: No discharge.     Extraocular Movements: Extraocular movements intact and EOM normal.     Conjunctiva/sclera: Conjunctivae normal.     Pupils: Pupils are equal, round, and reactive to light.  Neck:     Thyroid: No thyromegaly.     Vascular: No JVD.     Trachea: No tracheal deviation.  Cardiovascular:     Rate and Rhythm: Normal rate and regular rhythm.     Pulses: Intact distal  pulses.     Heart sounds: Normal heart sounds. No murmur heard. No friction rub. No gallop.   Pulmonary:     Effort: No respiratory distress.     Breath sounds: Normal breath sounds. No wheezing, rhonchi or rales.  Abdominal:     General: Bowel sounds are normal.     Palpations: Abdomen is soft. There is no hepatosplenomegaly or mass.     Tenderness: There is no abdominal tenderness. There is no CVA tenderness, right CVA tenderness, left CVA tenderness, guarding or rebound.  Musculoskeletal:        General: No tenderness or edema. Normal range of motion.     Cervical back: Normal range of motion and neck supple.  Lymphadenopathy:     Cervical: No cervical adenopathy.  Skin:    General: Skin is warm.     Capillary Refill: Capillary refill takes less than 2 seconds.     Findings: No rash.  Neurological:     Mental Status: He is alert and oriented to person, place, and time.     Cranial Nerves: No cranial nerve deficit.     Deep Tendon Reflexes: Strength normal and reflexes are normal and symmetric.     Wt Readings from Last 3 Encounters:  05/25/20 277 lb (125.6 kg)  05/11/20 275 lb (124.7 kg)  04/23/20 274 lb (124.3 kg)    BP 132/80   Pulse 60   Ht '5\' 7"'  (1.702 m)   Wt 277 lb (125.6 kg)   BMI 43.38 kg/m   Assessment and Plan: 1. Type 2 diabetes mellitus without complication, without long-term current use of insulin (HCC) Chronic.  Controlled.  Stable.  Patient did have a positive microalbuminuria for which he was started on lisinopril 2.5 mg daily.  Patient is on Ozempic but he has been out over the past week because of the cost of the medication patient's been given to weeks samples and will be contacting [facility] Hippo for assistance on obtaining his Ozempic. - lisinopril (ZESTRIL) 2.5 MG tablet; Take 1 tablet (2.5 mg total) by mouth daily.  Dispense: 90 tablet; Refill: 1  2. Positive for microalbuminuria Chronic.  Controlled.  Stable.  Blood pressure is 132/80.  We  will continue lisinopril 2.5 mg daily. - lisinopril (ZESTRIL) 2.5 MG tablet; Take 1 tablet (2.5 mg total) by mouth daily.  Dispense: 90 tablet; Refill: 1  3. Familial hypercholesterolemia Chronic.  Controlled.  Stable.  Patient has a mild elevation of LDL but is considered diabetic and after discussion of the importance of statin controlling diabetes patient is excepting to began atorvastatin 10 mg once a day. - atorvastatin (LIPITOR) 10 MG tablet; Take 1 tablet (10 mg total) by mouth daily.  Dispense: 90 tablet; Refill: 1  4. Encounter for screening examination for mental health and behavioral disorders Patient is in need of a evaluation so he can continue with his concealed carry.  We have made an appointment with psychiatry for evaluation by [facility] for clearance. - Ambulatory referral to Psychiatry  5. Erectile dysfunction due to diseases classified elsewhere .  Controlled.  Stable.  Continue sildenafil as directed. - sildenafil (VIAGRA) 100 MG tablet; Take 0.5-1 tablets (50-100 mg total) by mouth daily as needed for erectile dysfunction.  Dispense: 5 tablet; Refill: 11  6. Cigarette nicotine dependence without complication Patient has been advised of the health risks of smoking and counseled concerning cessation of tobacco products. I spent over 3 minutes for discussion and to answer questions.

## 2020-05-28 ENCOUNTER — Telehealth: Payer: Self-pay

## 2020-05-28 NOTE — Telephone Encounter (Signed)
Called pt after receiving a call from [facility] stating they do not do concealed to carry permits. The lady said that "psychologist will do this." She gave me the name and number to the nearest psychologist they know. [facility] out of [place]. [phone]. I gave pt the info

## 2020-05-29 DIAGNOSIS — E1165 Type 2 diabetes mellitus with hyperglycemia: Secondary | ICD-10-CM | POA: Diagnosis not present

## 2020-05-29 LAB — MICROALBUMIN, URINE: Microalb, Ur: 199

## 2020-05-29 LAB — LIPID PANEL
Cholesterol: 177 (ref 0–200)
HDL: 40 (ref 35–70)
LDL Cholesterol: 101
Triglycerides: 182 — AB (ref 40–160)

## 2020-07-20 ENCOUNTER — Ambulatory Visit: Payer: BC Managed Care – PPO | Admitting: Family Medicine

## 2020-08-14 DIAGNOSIS — E1165 Type 2 diabetes mellitus with hyperglycemia: Secondary | ICD-10-CM | POA: Diagnosis not present

## 2020-08-14 DIAGNOSIS — I10 Essential (primary) hypertension: Secondary | ICD-10-CM | POA: Diagnosis not present

## 2020-08-14 DIAGNOSIS — Z720 Tobacco use: Secondary | ICD-10-CM | POA: Diagnosis not present

## 2020-08-27 DIAGNOSIS — L400 Psoriasis vulgaris: Secondary | ICD-10-CM | POA: Diagnosis not present

## 2020-11-22 ENCOUNTER — Ambulatory Visit: Payer: BC Managed Care – PPO | Admitting: Family Medicine

## 2020-11-22 ENCOUNTER — Other Ambulatory Visit: Payer: Self-pay

## 2020-11-22 ENCOUNTER — Encounter: Payer: Self-pay | Admitting: Family Medicine

## 2020-11-22 VITALS — BP 122/78 | HR 78 | Ht 67.0 in | Wt 282.0 lb

## 2020-11-22 DIAGNOSIS — E7801 Familial hypercholesterolemia: Secondary | ICD-10-CM

## 2020-11-22 DIAGNOSIS — L400 Psoriasis vulgaris: Secondary | ICD-10-CM | POA: Diagnosis not present

## 2020-11-22 DIAGNOSIS — Z6841 Body Mass Index (BMI) 40.0 and over, adult: Secondary | ICD-10-CM | POA: Insufficient documentation

## 2020-11-22 DIAGNOSIS — I10 Essential (primary) hypertension: Secondary | ICD-10-CM

## 2020-11-22 MED ORDER — LOSARTAN POTASSIUM 25 MG PO TABS: 25.0000 mg | ORAL_TABLET | Freq: Every day | ORAL | 0 refills | Status: DC

## 2020-11-22 NOTE — Patient Instructions (Signed)

## 2020-11-22 NOTE — Progress Notes (Signed)
Date:  11/22/2020   Name:  Brandon Green   DOB:  08/31/1970   MRN:  [id number]   Chief Complaint: Hyperlipidemia, Hypertension, and Erectile Dysfunction  Hyperlipidemia This is a chronic problem. The current episode started more than 1 year ago. The problem is uncontrolled. Recent lipid tests were reviewed and are normal. He has no history of chronic renal disease, diabetes, hypothyroidism, liver disease, obesity or nephrotic syndrome. There are no known factors aggravating his hyperlipidemia. Pertinent negatives include no chest pain, focal sensory loss, focal weakness, leg pain, myalgias or shortness of breath. Current antihyperlipidemic treatment includes diet change. The current treatment provides moderate improvement of lipids. Compliance problems include adherence to exercise.  Risk factors for coronary artery disease include dyslipidemia.  Hypertension This is a chronic problem. The current episode started more than 1 year ago. The problem has been gradually improving since onset. The problem is uncontrolled. Pertinent negatives include no anxiety, blurred vision, chest pain, headaches, malaise/fatigue, neck pain, orthopnea, palpitations, peripheral edema, PND, shortness of breath or sweats. There is no history of chronic renal disease.  Erectile Dysfunction He reports no anxiety. Irritative symptoms do not include frequency or urgency. Pertinent negatives include no chills, dysuria or hematuria.   Lab Results  Component Value Date   CREATININE 0.64 10/25/2019   BUN 11 10/25/2019   NA 136 10/25/2019   K 4.0 10/25/2019   CL 102 10/25/2019   CO2 25 10/25/2019   Lab Results  Component Value Date   CHOL 165 01/17/2020   HDL 38 (L) 01/17/2020   LDLCALC 102 (H) 01/17/2020   TRIG 139 01/17/2020   No results found for: TSH Lab Results  Component Value Date   HGBA1C 8.9 (H) 12/06/2019   No results found for: WBC, HGB, HCT, MCV, PLT No results found for: ALT, AST, GGT, ALKPHOS,  BILITOT   Review of Systems  Constitutional:  Negative for chills, fever and malaise/fatigue.  HENT:  Negative for drooling, ear discharge, ear pain and sore throat.   Eyes:  Negative for blurred vision.  Respiratory:  Negative for cough, shortness of breath and wheezing.   Cardiovascular:  Negative for chest pain, palpitations, orthopnea, leg swelling and PND.  Gastrointestinal:  Negative for abdominal pain, blood in stool, constipation, diarrhea and nausea.  Endocrine: Negative for polydipsia.  Genitourinary:  Negative for dysuria, frequency, hematuria and urgency.  Musculoskeletal:  Negative for back pain, myalgias and neck pain.  Skin:  Negative for rash.  Allergic/Immunologic: Negative for environmental allergies.  Neurological:  Negative for dizziness, focal weakness and headaches.  Hematological:  Does not bruise/bleed easily.  Psychiatric/Behavioral:  Negative for suicidal ideas. The patient is not nervous/anxious.    Patient Active Problem List   Diagnosis Date Noted   Plaque psoriasis 10/03/2019    Allergies  Allergen Reactions   Percocet [Oxycodone-Acetaminophen] Itching    Past Surgical History:  Procedure Laterality Date   FRACTURE SURGERY     right lower leg    Social History   Tobacco Use   Smoking status: Former    Packs/day: 0.25    Pack years: 0.00    Types: Cigarettes    Quit date: 06/19/2020    Years since quitting: 0.4   Smokeless tobacco: Never  Vaping Use   Vaping Use: Never used  Substance Use Topics   Alcohol use: Not Currently   Drug use: Never     Medication list has been reviewed and updated.  Current Meds  Medication Sig  Blood Glucose Monitoring Suppl (FREESTYLE FREEDOM) KIT 1 Units by Does not apply route in the morning and at bedtime.   clobetasol ointment (TEMOVATE) 0.05 % Apply topically.   glucose blood test strip Check your sugar in the morning before you eat breakfast, and one hour after a meal.   ibuprofen (ADVIL) 600 MG  tablet Take 1 tablet (600 mg total) by mouth every 6 (six) hours as needed.   lisinopril (ZESTRIL) 2.5 MG tablet Take by mouth.   metFORMIN (GLUCOPHAGE-XR) 750 MG 24 hr tablet Take by mouth.   Semaglutide,0.25 or 0.5MG/DOS, 2 MG/1.5ML SOPN [facility]- [facility]   sildenafil (VIAGRA) 100 MG tablet Take by mouth.    PHQ 2/9 Scores 11/22/2020 01/17/2020 12/06/2019  PHQ - 2 Score 0 0 0  PHQ- 9 Score 0 0 0    GAD 7 : Generalized Anxiety Score 11/22/2020 01/17/2020 12/06/2019  Nervous, Anxious, on Edge 0 0 0  Control/stop worrying 0 0 0  Worry too much - different things 0 0 0  Trouble relaxing 0 0 0  Restless 0 0 0  Easily annoyed or irritable 0 0 0  Afraid - awful might happen 0 0 0  Total GAD 7 Score 0 0 0  Anxiety Difficulty Not difficult at all - -    BP Readings from Last 3 Encounters:  11/22/20 122/78  05/25/20 132/80  05/11/20 109/63    Physical Exam Vitals and nursing note reviewed.  HENT:     Head: Normocephalic.     Right Ear: Tympanic membrane, ear canal and external ear normal.     Left Ear: Tympanic membrane, ear canal and external ear normal.     Nose: Nose normal. No congestion.  Eyes:     General: No scleral icterus.       Right eye: No discharge.        Left eye: No discharge.     Conjunctiva/sclera: Conjunctivae normal.     Pupils: Pupils are equal, round, and reactive to light.  Neck:     Thyroid: No thyromegaly.     Vascular: No JVD.     Trachea: No tracheal deviation.  Cardiovascular:     Rate and Rhythm: Normal rate and regular rhythm.     Heart sounds: Normal heart sounds. No murmur heard.   No friction rub. No gallop.  Pulmonary:     Effort: No respiratory distress.     Breath sounds: Normal breath sounds. No stridor. No wheezing, rhonchi or rales.  Abdominal:     General: Bowel sounds are normal.     Palpations: Abdomen is soft. There is no mass.     Tenderness: There is no abdominal tenderness. There is no guarding or rebound.  Musculoskeletal:         General: No tenderness. Normal range of motion.     Cervical back: Normal range of motion and neck supple.  Lymphadenopathy:     Cervical: No cervical adenopathy.  Skin:    General: Skin is warm.     Findings: No rash.  Neurological:     Mental Status: He is alert and oriented to person, place, and time.     Cranial Nerves: No cranial nerve deficit.     Deep Tendon Reflexes: Reflexes are normal and symmetric.    Wt Readings from Last 3 Encounters:  11/22/20 282 lb (127.9 kg)  05/25/20 277 lb (125.6 kg)  05/11/20 275 lb (124.7 kg)    BP 122/78   Pulse 78  Ht _0  (1.702 m)   Wt 282 lb (127.9 kg)   SpO2 99%   BMI 44.17 kg/m   Assessment and Plan:  1. Primary hypertension Chronic.  Controlled.  Stable.  Blood pressure today is 122/78.  We will continue with losartan 25 mg once a day.  We will recheck blood pressure in 6 to 8 weeks. - losartan (COZAAR) 25 MG tablet; Take 1 tablet (25 mg total) by mouth daily.  Dispense: 90 tablet; Refill: 0  2. Familial hypercholesterolemia .  Controlled.  Stable.  Dietary approaches been given to patient and he has quit smoking and we are looking forward to him also losing weight as well.  3. Plaque psoriasis Patient has a history of plaque psoriasis and is on Enbrel but is not responding to the satisfaction of the patient if any at all.  Patient would like to schedule dermatology more in the vicinity of 11 so that he can reevaluate and possibly determine if new direction needs to be made. - Ambulatory referral to Dermatology  4. Adult BMI 40.0-44.9 kg/sq m [facility]) Health risks of being over weight were discussed and patient was counseled on weight loss options and exercise.

## 2020-11-23 ENCOUNTER — Telehealth: Payer: Self-pay | Admitting: Family Medicine

## 2020-11-23 DIAGNOSIS — E1165 Type 2 diabetes mellitus with hyperglycemia: Secondary | ICD-10-CM | POA: Diagnosis not present

## 2020-11-23 DIAGNOSIS — Z720 Tobacco use: Secondary | ICD-10-CM | POA: Diagnosis not present

## 2020-11-23 DIAGNOSIS — I10 Essential (primary) hypertension: Secondary | ICD-10-CM | POA: Diagnosis not present

## 2020-11-23 LAB — HEMOGLOBIN A1C: Hemoglobin A1C: 6.3

## 2020-11-23 NOTE — Telephone Encounter (Signed)
Pt came to pick up prescription from the pharmacy and was advised that it is another BP medication. Pt is currently on BP medication and would like to know should he stop taking that.  Pt thought he was to pick up a cholesterol medication. Please advise CB- [phone]

## 2020-12-10 DIAGNOSIS — Z79899 Other long term (current) drug therapy: Secondary | ICD-10-CM | POA: Diagnosis not present

## 2020-12-10 DIAGNOSIS — L400 Psoriasis vulgaris: Secondary | ICD-10-CM | POA: Diagnosis not present

## 2020-12-10 DIAGNOSIS — Z7689 Persons encountering health services in other specified circumstances: Secondary | ICD-10-CM | POA: Diagnosis not present

## 2020-12-26 DIAGNOSIS — M359 Systemic involvement of connective tissue, unspecified: Secondary | ICD-10-CM | POA: Diagnosis not present

## 2021-01-17 ENCOUNTER — Ambulatory Visit: Payer: BC Managed Care – PPO | Admitting: Family Medicine

## 2021-01-24 ENCOUNTER — Encounter: Payer: Self-pay | Admitting: Family Medicine

## 2021-01-24 ENCOUNTER — Ambulatory Visit: Payer: BC Managed Care – PPO | Admitting: Family Medicine

## 2021-01-24 ENCOUNTER — Other Ambulatory Visit: Payer: Self-pay

## 2021-01-24 DIAGNOSIS — I10 Essential (primary) hypertension: Secondary | ICD-10-CM

## 2021-01-24 MED ORDER — LOSARTAN POTASSIUM 25 MG PO TABS: 25.0000 mg | ORAL_TABLET | Freq: Every day | ORAL | 1 refills | Status: DC

## 2021-01-24 NOTE — Progress Notes (Signed)
Date:  01/24/2021   Name:  Brandon Green   DOB:  1971-02-15   MRN:  [id number]   Chief Complaint: Hypertension (Recheck- changed from lisinopril to losartan)  Hypertension This is a chronic problem. The current episode started more than 1 year ago. The problem has been gradually improving since onset. The problem is controlled. Pertinent negatives include no anxiety, blurred vision, chest pain, headaches, malaise/fatigue, neck pain, orthopnea, palpitations, peripheral edema, PND, shortness of breath or sweats. There are no associated agents to hypertension. Past treatments include angiotensin blockers. The current treatment provides moderate improvement. There are no compliance problems.  There is no history of angina, kidney disease, CAD/MI, CVA, heart failure, left ventricular hypertrophy, PVD or retinopathy. There is no history of chronic renal disease, a hypertension causing med or renovascular disease.   Lab Results  Component Value Date   CREATININE 0.64 10/25/2019   BUN 11 10/25/2019   NA 136 10/25/2019   K 4.0 10/25/2019   CL 102 10/25/2019   CO2 25 10/25/2019   Lab Results  Component Value Date   CHOL 165 01/17/2020   HDL 38 (L) 01/17/2020   LDLCALC 102 (H) 01/17/2020   TRIG 139 01/17/2020   No results found for: TSH Lab Results  Component Value Date   HGBA1C 8.9 (H) 12/06/2019   No results found for: WBC, HGB, HCT, MCV, PLT No results found for: ALT, AST, GGT, ALKPHOS, BILITOT   Review of Systems  Constitutional:  Negative for chills, fever and malaise/fatigue.  HENT:  Negative for drooling, ear discharge, ear pain and sore throat.   Eyes:  Negative for blurred vision.  Respiratory:  Negative for cough, shortness of breath and wheezing.   Cardiovascular:  Negative for chest pain, palpitations, orthopnea, leg swelling and PND.  Gastrointestinal:  Negative for abdominal pain, blood in stool, constipation, diarrhea and nausea.  Endocrine: Negative for polydipsia.   Genitourinary:  Negative for dysuria, frequency, hematuria and urgency.  Musculoskeletal:  Negative for back pain, myalgias and neck pain.  Skin:  Negative for rash.  Allergic/Immunologic: Negative for environmental allergies.  Neurological:  Negative for dizziness and headaches.  Hematological:  Does not bruise/bleed easily.  Psychiatric/Behavioral:  Negative for suicidal ideas. The patient is not nervous/anxious.    Patient Active Problem List   Diagnosis Date Noted   Adult BMI 40.0-44.9 kg/sq m ([place]) 11/22/2020   Plaque psoriasis 10/03/2019    Allergies  Allergen Reactions   Percocet [Oxycodone-Acetaminophen] Itching    Past Surgical History:  Procedure Laterality Date   FRACTURE SURGERY     right lower leg    Social History   Tobacco Use   Smoking status: Former    Packs/day: 0.25    Types: Cigarettes    Quit date: 06/19/2020    Years since quitting: 0.6   Smokeless tobacco: Never  Vaping Use   Vaping Use: Never used  Substance Use Topics   Alcohol use: Not Currently   Drug use: Never     Medication list has been reviewed and updated.  Current Meds  Medication Sig   atorvastatin (LIPITOR) 10 MG tablet Take 1 tablet (10 mg total) by mouth daily.   Blood Glucose Monitoring Suppl (FREESTYLE FREEDOM) KIT 1 Units by Does not apply route in the morning and at bedtime.   clobetasol ointment (TEMOVATE) 0.05 % Apply topically.   glucose blood test strip Check your sugar in the morning before you eat breakfast, and one hour after a meal.  ibuprofen (ADVIL) 600 MG tablet Take 1 tablet (600 mg total) by mouth every 6 (six) hours as needed.   losartan (COZAAR) 25 MG tablet Take 1 tablet (25 mg total) by mouth daily.   metFORMIN (GLUCOPHAGE-XR) 750 MG 24 hr tablet Take 750 mg by mouth daily with breakfast.   OZEMPIC, 1 MG/DOSE, 4 MG/3ML SOPN Inject into the skin. [name]   sildenafil (VIAGRA) 100 MG tablet Take by mouth.   SKYRIZI PEN 150 MG/ML SOAJ Inject into the skin.    [DISCONTINUED] Semaglutide,0.25 or 0.5MG/DOS, 2 MG/1.5ML SOPN Inject 1 mg as directed once a week. [facility]    PHQ 2/9 Scores 11/22/2020 01/17/2020 12/06/2019  PHQ - 2 Score 0 0 0  PHQ- 9 Score 0 0 0    GAD 7 : Generalized Anxiety Score 11/22/2020 01/17/2020 12/06/2019  Nervous, Anxious, on Edge 0 0 0  Control/stop worrying 0 0 0  Worry too much - different things 0 0 0  Trouble relaxing 0 0 0  Restless 0 0 0  Easily annoyed or irritable 0 0 0  Afraid - awful might happen 0 0 0  Total GAD 7 Score 0 0 0  Anxiety Difficulty Not difficult at all - -    BP Readings from Last 3 Encounters:  01/24/21 120/80  11/22/20 122/78  05/25/20 132/80    Physical Exam Vitals and nursing note reviewed.  HENT:     Head: Normocephalic.     Right Ear: Tympanic membrane, ear canal and external ear normal.     Left Ear: Tympanic membrane, ear canal and external ear normal.     Nose: Nose normal.  Eyes:     General: No scleral icterus.       Right eye: No discharge.        Left eye: No discharge.     Conjunctiva/sclera: Conjunctivae normal.     Pupils: Pupils are equal, round, and reactive to light.  Neck:     Thyroid: No thyromegaly.     Vascular: No carotid bruit or JVD.     Trachea: No tracheal deviation.  Cardiovascular:     Rate and Rhythm: Normal rate and regular rhythm.     Heart sounds: Normal heart sounds. No murmur heard.   No friction rub. No gallop.  Pulmonary:     Effort: No respiratory distress.     Breath sounds: Normal breath sounds. No stridor. No wheezing, rhonchi or rales.  Abdominal:     General: Bowel sounds are normal.     Palpations: Abdomen is soft. There is no mass.     Tenderness: There is no abdominal tenderness. There is no guarding or rebound.  Musculoskeletal:        General: No tenderness. Normal range of motion.     Cervical back: Normal range of motion and neck supple.  Lymphadenopathy:     Cervical: No cervical adenopathy.  Skin:    General: Skin is  warm.     Findings: No rash.  Neurological:     Mental Status: He is alert and oriented to person, place, and time.     Cranial Nerves: No cranial nerve deficit.     Deep Tendon Reflexes: Reflexes are normal and symmetric.    Wt Readings from Last 3 Encounters:  01/24/21 277 lb (125.6 kg)  11/22/20 282 lb (127.9 kg)  05/25/20 277 lb (125.6 kg)    BP 120/80   Pulse 64   Ht _0  (1.702 m)   Wt  277 lb (125.6 kg)   BMI 43.38 kg/m   Assessment and Plan:  1. Primary hypertension Chronic.  Controlled.  Stable.  Uncomplicated.  Blood pressure 120/80.  Continue losartan 25 mg once a day.  We will recheck patient in 90 days.  Review of patient's last renal panel was acceptable as is his lipid as well.  Patient is also been encouraged to continue to lose weight. - losartan (COZAAR) 25 MG tablet; Take 1 tablet (25 mg total) by mouth daily.  Dispense: 90 tablet; Refill: 1

## 2021-02-21 ENCOUNTER — Ambulatory Visit: Payer: Self-pay | Admitting: *Deleted

## 2021-02-21 NOTE — Telephone Encounter (Signed)
Reason for Disposition . [1] Caller has URGENT medicine question about med that PCP or specialist prescribed AND [2] triager unable to answer question . [1] MILD or MODERATE vomiting AND [2] present > 48 hours (2 days) (Exception: mild vomiting with associated diarrhea)  Answer Assessment - Initial Assessment Questions 1. NAME of MEDICATION: "What medicine are you calling about?"     Pt called me in.   Brandon Green 25 mg and hydro and metformin 750 mg 2. QUESTION: "What is your question?" (e.g., double dose of medicine, side effect)     I eat supper 5:30 -7:00 and I'm getting sick.   This morning I'm feeling bad.   3. PRESCRIBING HCP: "Who prescribed it?" Reason: if prescribed by specialist, call should be referred to that group.     Dr. Yetta Barre   I was on metformin 500 but was changed me to 750 twice a day.   They upped my dose of BP medication.   It's been in the last 2 weeks I've been vomiting first thing in the morning.   I ate lunch vomited today.   I don't have an appetite. I'm on Ozempic by Dr. Rosaura Carpenter.     I got a letter saying Dr. Karl Luke is no longer in the office at the [facility].   I have an appt Monday at the [facility] with her replacement.   4. SYMPTOMS: "Do you have any symptoms?"     Vomiting every morning.   Today I just don't feel good.   The vomiting has been going on for 2 weeks.   No fever.   No diarrhea or headaches.   I just don't feel like doing nothing today.    5. SEVERITY: If symptoms are present, ask "Are they mild, moderate or severe?"     Vomiting very morning. 6. PREGNANCY:  "Is there any chance that you are pregnant?" "When was your last menstrual period?"     N/A  Answer Assessment - Initial Assessment Questions 1. VOMITING SEVERITY: "How many times have you vomited in the past 24 hours?"     - MILD:  1 - 2 times/day    - MODERATE: 3 - 5 times/day, decreased oral intake without significant weight loss or symptoms of dehydration    - SEVERE: 6 or  more times/day, vomits everything or nearly everything, with significant weight loss, symptoms of dehydration     Every morning for the last 2 weeks when I first wake up I vomit.   I usually vomit one time then I feel alright.   I don't have an appetite.   I'm eating during the day I'm not vomiting. Today I vomited my lunch for the first time and I don't feel good.   2. ONSET: "When did the vomiting begin?"      2 weeks ago I feel better in the mornings after I vomit.    I vomit before I take my medicines.   He eats breakfast at 9:00 at work and takes his medicines.   Today he vomited after eating lunch for the first time. 3. FLUIDS: "What fluids or food have you vomited up today?" "Have you been able to keep any fluids down?"     Vomited up lunch.   4. ABDOMINAL PAIN: "Are your having any abdominal pain?" If yes : "How bad is it and what does it feel like?" (e.g., crampy, dull, intermittent, constant)      No stomach or abd pain.  I just feel nauseas then I vomit and I feel find after that. 5. DIARRHEA: "Is there any diarrhea?" If Yes, ask: "How many times today?"      No diarrhea.   6. CONTACTS: "Is there anyone else in the family with the same symptoms?"      No one sick at home or work 7. CAUSE: "What do you think is causing your vomiting?"     I think my medications. 8. HYDRATION STATUS: "Any signs of dehydration?" (e.g., dry mouth [not only dry lips], too weak to stand) "When did you last urinate?"     I keep my lunch and dinner down normally.   I'm drinking fluids fine without vomiting.   9. OTHER SYMPTOMS: "Do you have any other symptoms?" (e.g., fever, headache, vertigo, vomiting blood or coffee grounds, recent head injury)     No dizziness or lightheaded.   Not feeling well today.   Has not had an appetite for about a month.    10. PREGNANCY: "Is there any chance you are pregnant?" "When was your last menstrual period?"       N/A  Protocols used: Medication Question Call-A-AH,  Vomiting-A-AH

## 2021-02-21 NOTE — Telephone Encounter (Signed)
Pt is calling in c/o vomiting every morning for the last 2 weeks.   Today he just doesn't feel good.   He is wondering if it's the medication he is on.   He was seen 01/24/2021 by Dr. Yetta Barre and his medications were adjusted.   He is wondering if it's the medicine.  I called into [facility] to see if he could be seen however Dr. Yetta Barre is out of the office.   They suggested he go to the urgent care since he has been vomiting very morning.    He was agreeable and going to [facility] [place].

## 2021-02-22 ENCOUNTER — Encounter: Payer: Self-pay | Admitting: Emergency Medicine

## 2021-02-22 ENCOUNTER — Ambulatory Visit
Admission: EM | Admit: 2021-02-22 | Discharge: 2021-02-22 | Disposition: A | Discharge status: Home / Self Care | Payer: BC Managed Care – PPO | Attending: Emergency Medicine | Admitting: Emergency Medicine

## 2021-02-22 ENCOUNTER — Ambulatory Visit (INDEPENDENT_AMBULATORY_CARE_PROVIDER_SITE_OTHER): Payer: BC Managed Care – PPO

## 2021-02-22 ENCOUNTER — Other Ambulatory Visit: Payer: Self-pay

## 2021-02-22 DIAGNOSIS — R1115 Cyclical vomiting syndrome unrelated to migraine: Secondary | ICD-10-CM

## 2021-02-22 DIAGNOSIS — R112 Nausea with vomiting, unspecified: Secondary | ICD-10-CM

## 2021-02-22 DIAGNOSIS — R1110 Vomiting, unspecified: Secondary | ICD-10-CM | POA: Diagnosis not present

## 2021-02-22 MED ORDER — METOCLOPRAMIDE HCL 10 MG PO TABS: 10.0000 mg | ORAL_TABLET | Freq: Four times a day (QID) | ORAL | 0 refills | Status: DC

## 2021-02-22 NOTE — ED Provider Notes (Signed)
[facility]    CSN: [id number] Arrival date & time: 02/22/21  8315      History   Chief Complaint Chief Complaint  Patient presents with   Emesis    HPI Brandon Green is a 50 y.o. male.   HPI  49 year old male here for evaluation of GI complaints.  Patient reports that for the last 2 weeks he has woken up in the morning feeling sick and will have a bout of emesis.  He reports the emesis is undigested food from the night [place] previously.  He states that his symptoms will typically improve but then return each morning.  Yesterday morning he had an episode of vomiting with dinner from night before, continued to feel bad all day, and then vomited again when he ate pizza for lunch.  He reports he had 1 slice but could not eat anymore due to lack of appetite.  Last night he ate dinner and then had an additional episode of vomiting and he vomited this morning.  This morning's emesis was mostly bilious as he vomited his dinner last night.  He has been checking his blood sugar and last night after vomiting his blood sugar was 148.  He checked it again this morning it was 117.  This has not been associate with fever, abdominal pain, constipation or diarrhea, or blood in the stool.  Patient denies any sour taste or burning in his throat in the morning.  No history of GI pathology in the family.  Past Medical History:  Diagnosis Date   Diabetes mellitus without complication ([place])    Psoriasis     Patient Active Problem List   Diagnosis Date Noted   Adult BMI 40.0-44.9 kg/sq m ([place]) 11/22/2020   Plaque psoriasis 10/03/2019    Past Surgical History:  Procedure Laterality Date   FRACTURE SURGERY     right lower leg       Home Medications    Prior to Admission medications   Medication Sig Start Date End Date Taking? Authorizing Provider  atorvastatin (LIPITOR) 10 MG tablet Take 1 tablet (10 mg total) by mouth daily. 05/25/20  Yes Juline Patch, MD  losartan (COZAAR)  25 MG tablet Take 1 tablet (25 mg total) by mouth daily. 01/24/21  Yes Juline Patch, MD  metFORMIN (GLUCOPHAGE-XR) 750 MG 24 hr tablet Take 750 mg by mouth daily with breakfast.   Yes [provider]  metoCLOPramide (REGLAN) 10 MG tablet Take 1 tablet (10 mg total) by mouth every 6 (six) hours. 02/22/21  Yes Margarette Canada, NP  OZEMPIC, 1 MG/DOSE, 4 MG/3ML SOPN Inject into the skin. [name] 12/27/20  Yes [provider]  SKYRIZI PEN 150 MG/ML SOAJ Inject into the skin. 12/28/20  Yes [provider]  Blood Glucose Monitoring Suppl (FREESTYLE FREEDOM) KIT 1 Units by Does not apply route in the morning and at bedtime. 10/25/19   Melynda Ripple, MD  clobetasol ointment (TEMOVATE) 0.05 % Apply topically.    [provider]  glucose blood test strip Check your sugar in the morning before you eat breakfast, and one hour after a meal. 10/25/19   Melynda Ripple, MD  ibuprofen (ADVIL) 600 MG tablet Take 1 tablet (600 mg total) by mouth every 6 (six) hours as needed. 04/16/19   Melynda Ripple, MD  sildenafil (VIAGRA) 100 MG tablet Take by mouth. 01/17/20   [provider]    Family History Family History  Problem Relation Age of Onset   Diabetes  Mother    Diabetes Brother    Cancer Maternal Aunt    Heart disease Maternal Uncle    Stroke Maternal Uncle    Cancer Maternal Grandfather    Heart disease Maternal Grandfather    Hypertension Maternal Grandfather     Social History Social History   Tobacco Use   Smoking status: Former    Packs/day: 0.25    Types: Cigarettes    Quit date: 06/19/2020    Years since quitting: 0.6   Smokeless tobacco: Never  Vaping Use   Vaping Use: Never used  Substance Use Topics   Alcohol use: Not Currently   Drug use: Never     Allergies   Percocet [oxycodone-acetaminophen]   Review of Systems Review of Systems  Constitutional:  Negative for activity change, appetite change and fever.  Gastrointestinal:   Positive for nausea and vomiting. Negative for abdominal pain, blood in stool, constipation and diarrhea.  Skin:  Negative for rash.  Hematological: Negative.   Psychiatric/Behavioral: Negative.      Physical Exam Triage Vital Signs ED Triage Vitals  Enc Vitals Group     BP 02/22/21 0856 124/82     Pulse Rate 02/22/21 0856 86     Resp 02/22/21 0856 16     Temp 02/22/21 0856 98.2 F (36.8 C)     Temp Source 02/22/21 0856 Oral     SpO2 02/22/21 0856 96 %     Weight 02/22/21 0852 276 lb 14.4 oz (125.6 kg)     Height 02/22/21 0852 '5\' 7"'  (1.702 m)     Head Circumference --      Peak Flow --      Pain Score 02/22/21 0852 0     Pain Loc --      Pain Edu? --      Excl. in [place]? --    No data found.  Updated Vital Signs BP 124/82 (BP Location: Left Arm)   Pulse 86   Temp 98.2 F (36.8 C) (Oral)   Resp 16   Ht '5\' 7"'  (1.702 m)   Wt 276 lb 14.4 oz (125.6 kg)   SpO2 96%   BMI 43.37 kg/m   Visual Acuity Right Eye Distance:   Left Eye Distance:   Bilateral Distance:    Right Eye Near:   Left Eye Near:    Bilateral Near:     Physical Exam Vitals and nursing note reviewed.  Constitutional:      General: He is not in acute distress.    Appearance: Normal appearance. He is obese. He is not ill-appearing.  HENT:     Head: Normocephalic and atraumatic.  Cardiovascular:     Rate and Rhythm: Normal rate and regular rhythm.     Pulses: Normal pulses.     Heart sounds: Normal heart sounds. No murmur heard.   No gallop.  Pulmonary:     Effort: Pulmonary effort is normal.     Breath sounds: Normal breath sounds. No wheezing, rhonchi or rales.  Abdominal:     General: Bowel sounds are normal. There is no distension.     Palpations: Abdomen is soft.     Tenderness: There is no abdominal tenderness. There is no guarding or rebound.  Skin:    General: Skin is warm and dry.     Capillary Refill: Capillary refill takes less than 2 seconds.     Findings: No erythema or rash.   Neurological:     General: No focal deficit present.  Mental Status: He is alert and oriented to person, place, and time.  Psychiatric:        Mood and Affect: Mood normal.        Behavior: Behavior normal.        Thought Content: Thought content normal.        Judgment: Judgment normal.     UC Treatments / Results  Labs (all labs ordered are listed, but only abnormal results are displayed) Labs Reviewed - No data to display  EKG   Radiology DG Abd 2 Views  Result Date: 02/22/2021 CLINICAL DATA:  Vomiting for proximally 2 weeks. EXAM: ABDOMEN - 2 VIEW COMPARISON:  None. FINDINGS: The bowel gas pattern is normal. There is no evidence of bowel wall thickening, free intraperitoneal air or suspicious calcification. Vascular calcifications are noted in the pelvis. There are mild degenerative changes within the spine. IMPRESSION: No evidence of bowel obstruction or other active abdominal process by radiography. Electronically Signed   By: Richardean Sale M.D.   On: 02/22/2021 10:20    Procedures Procedures (including critical care time)  Medications Ordered in UC Medications - No data to display  Initial Impression / Assessment and Plan / UC Course  I have reviewed the triage vital signs and the nursing notes.  Pertinent labs & imaging results that were available during my care of the patient were reviewed by me and considered in my medical decision making (see chart for details).  Patient is a nontoxic-appearing 50 year old male here for evaluation of vomiting that is been present for the past 2 weeks.  Vomiting is typically only in the mornings and the emesis consist of undigested food from the night before.  This is not associate with abdominal pain, fever, constipation, or diarrhea.  No blood in stool.  No history of GI illness in the family or personally.  Patient reports that his symptoms worsened yesterday as he had emesis in the morning then continued to feel bad throughout  the day.  That afternoon they ordered pizza for lunch and he was able to eat 1 slice before feel like he could not eat anymore and having episode of emesis.  Last evening he felt nauseous and vomited immediately following dinner.  He is also had emesis this morning but it was more bilious as he vomited his food last night.  This is not associated with pain.  Patient did report that he felt full after 1 slice of pizza yesterday at lunch which may demonstrate early satiety.  Patient's history and symptoms are concerning for pyloric stenosis or other gastric pathology.  Will obtain 2 view abdomen to look for air-fluid levels or constipation which may be adding to the emesis.  If the two-view the abdomen is unremarkable I will discharge patient home on Reglan and refer him to gastroenterology.  2 view abdomen x-rays independently reviewed and evaluated by me.  Impression: No air-fluid levels noted.  There is a mild stool burden present in the ascending colon.  No other abnormalities noted.  Radiology overread is pending. Radiology impression is negative acute abdominal series.  Will discharge patient home on Reglan to help with nausea and gastric emptying.  We will also refer patient to gastroenterology for evaluation.   Final Clinical Impressions(s) / UC Diagnoses   Final diagnoses:  Emesis, persistent  Nausea and vomiting, unspecified vomiting type     Discharge Instructions      You are treated did not reveal any acute intra-abdominal pathology.  Use the  Reglan every 6 hours as needed for nausea and to help facilitate the movement of food and bile from your stomach into your intestines.  I have made a referral to [facility] gastroenterology for you to be evaluated for your symptoms.  You can premedicate 20 minutes before meals with Reglan to see if this helps her symptoms.  Eat small frequent meals and try to eat clean.  No fried or processed foods as this may be contributing to your  symptoms.  If you develop worsening of the nausea and vomiting, cannot keep down fluids, develop pain or fever you need to go to the emergency room for evaluation.     ED Prescriptions     Medication Sig Dispense Auth. Provider   metoCLOPramide (REGLAN) 10 MG tablet Take 1 tablet (10 mg total) by mouth every 6 (six) hours. 30 tablet Margarette Canada, NP      PDMP not reviewed this encounter.   Margarette Canada, NP 02/22/21 1039

## 2021-02-22 NOTE — Discharge Instructions (Addendum)
You are treated did not reveal any acute intra-abdominal pathology.  Use the Reglan every 6 hours as needed for nausea and to help facilitate the movement of food and bile from your stomach into your intestines.  I have made a referral to [facility] gastroenterology for you to be evaluated for your symptoms.  You can premedicate 20 minutes before meals with Reglan to see if this helps her symptoms.  Eat small frequent meals and try to eat clean.  No fried or processed foods as this may be contributing to your symptoms.  If you develop worsening of the nausea and vomiting, cannot keep down fluids, develop pain or fever you need to go to the emergency room for evaluation.

## 2021-02-22 NOTE — ED Triage Notes (Signed)
Patient states that for the past 2 weeks in the morning he would vomit.  Patient states that he was not able to get an appointment with his PCP.  Patient denies any diarrhea.

## 2021-02-25 ENCOUNTER — Telehealth: Payer: Self-pay

## 2021-02-25 NOTE — Telephone Encounter (Signed)
Pt. Returning call to schedule procedure. He said he can be reached around 4 that's when he gets off work

## 2021-02-28 NOTE — Telephone Encounter (Signed)
Returned call to patient. LVM to call back.

## 2021-03-17 ENCOUNTER — Other Ambulatory Visit: Payer: Self-pay | Admitting: Family Medicine

## 2021-03-17 DIAGNOSIS — I10 Essential (primary) hypertension: Secondary | ICD-10-CM

## 2021-03-17 NOTE — Telephone Encounter (Signed)
Last RF 01/24/21 #90 1 RF - too soon

## 2021-03-27 ENCOUNTER — Other Ambulatory Visit: Payer: Self-pay | Admitting: Family Medicine

## 2021-03-27 DIAGNOSIS — I10 Essential (primary) hypertension: Secondary | ICD-10-CM

## 2021-05-27 ENCOUNTER — Other Ambulatory Visit: Payer: Self-pay

## 2021-05-27 ENCOUNTER — Ambulatory Visit: Payer: BC Managed Care – PPO | Admitting: Family Medicine

## 2021-05-27 ENCOUNTER — Encounter: Payer: Self-pay | Admitting: Family Medicine

## 2021-05-27 VITALS — BP 130/80 | HR 72 | Ht 67.0 in | Wt 286.0 lb

## 2021-05-27 DIAGNOSIS — Z1211 Encounter for screening for malignant neoplasm of colon: Secondary | ICD-10-CM

## 2021-05-27 DIAGNOSIS — I10 Essential (primary) hypertension: Secondary | ICD-10-CM | POA: Diagnosis not present

## 2021-05-27 DIAGNOSIS — E7801 Familial hypercholesterolemia: Secondary | ICD-10-CM

## 2021-05-27 MED ORDER — PEG 3350-KCL-NA BICARB-NACL 420 G PO SOLR: 4000.0000 mL | Freq: Once | ORAL | 0 refills | Status: AC

## 2021-05-27 MED ORDER — ATORVASTATIN CALCIUM 10 MG PO TABS: 10.0000 mg | ORAL_TABLET | Freq: Every day | ORAL | 1 refills | Status: DC

## 2021-05-27 MED ORDER — LOSARTAN POTASSIUM 25 MG PO TABS: 25.0000 mg | ORAL_TABLET | Freq: Every day | ORAL | 1 refills | Status: DC

## 2021-05-27 NOTE — Progress Notes (Signed)
Date:  05/27/2021   Name:  Brandon Green   DOB:  Sep 23, 1970   MRN:  [id number]   Chief Complaint: Hypertension, Hyperlipidemia, and Colon Cancer Screening  Hypertension This is a chronic problem. The current episode started more than 1 year ago. The problem has been waxing and waning since onset. The problem is controlled. Pertinent negatives include no anxiety, blurred vision, chest pain, headaches, malaise/fatigue, neck pain, orthopnea, palpitations, peripheral edema, PND, shortness of breath or sweats. There are no associated agents to hypertension. Past treatments include angiotensin blockers. The current treatment provides moderate improvement. There are no compliance problems.  There is no history of angina, kidney disease, CAD/MI, CVA, heart failure, left ventricular hypertrophy, PVD or retinopathy. There is no history of chronic renal disease, a hypertension causing med or renovascular disease.  Hyperlipidemia This is a chronic problem. The current episode started more than 1 year ago. The problem is controlled. Recent lipid tests were reviewed and are normal. He has no history of chronic renal disease. Pertinent negatives include no chest pain, myalgias or shortness of breath. Current antihyperlipidemic treatment includes statins. The current treatment provides moderate improvement of lipids. There are no compliance problems.  Risk factors for coronary artery disease include hypertension, diabetes mellitus and dyslipidemia.   Lab Results  Component Value Date   NA 136 10/25/2019   K 4.0 10/25/2019   CO2 25 10/25/2019   GLUCOSE 238 (H) 10/25/2019   BUN 11 10/25/2019   CREATININE 0.64 10/25/2019   CALCIUM 8.8 (L) 10/25/2019   GFRNONAA >60 10/25/2019   Lab Results  Component Value Date   CHOL 177 05/29/2020   HDL 40 05/29/2020   LDLCALC 101 05/29/2020   TRIG 182 (A) 05/29/2020   No results found for: TSH Lab Results  Component Value Date   HGBA1C 6.3 11/23/2020   No  results found for: WBC, HGB, HCT, MCV, PLT No results found for: ALT, AST, GGT, ALKPHOS, BILITOT No results found for: 25OHVITD2, 25OHVITD3, VD25OH   Review of Systems  Constitutional:  Negative for chills, fever and malaise/fatigue.  HENT:  Negative for drooling, ear discharge, ear pain and sore throat.   Eyes:  Negative for blurred vision.  Respiratory:  Negative for cough, shortness of breath and wheezing.   Cardiovascular:  Negative for chest pain, palpitations, orthopnea, leg swelling and PND.  Gastrointestinal:  Negative for abdominal pain, blood in stool, constipation, diarrhea and nausea.  Endocrine: Negative for polydipsia.  Genitourinary:  Negative for dysuria, frequency, hematuria and urgency.  Musculoskeletal:  Negative for back pain, myalgias and neck pain.  Skin:  Negative for rash.  Allergic/Immunologic: Negative for environmental allergies.  Neurological:  Negative for dizziness and headaches.  Hematological:  Does not bruise/bleed easily.  Psychiatric/Behavioral:  Negative for suicidal ideas. The patient is not nervous/anxious.    Patient Active Problem List   Diagnosis Date Noted   Adult BMI 40.0-44.9 kg/sq m ([place]) 11/22/2020   Plaque psoriasis 10/03/2019    Allergies  Allergen Reactions   Percocet [Oxycodone-Acetaminophen] Itching    Past Surgical History:  Procedure Laterality Date   FRACTURE SURGERY     right lower leg    Social History   Tobacco Use   Smoking status: Former    Packs/day: 0.25    Types: Cigarettes    Quit date: 06/19/2020    Years since quitting: 0.9   Smokeless tobacco: Never  Vaping Use   Vaping Use: Never used  Substance Use Topics   Alcohol use:  Not Currently   Drug use: Never     Medication list has been reviewed and updated.  Current Meds  Medication Sig   atorvastatin (LIPITOR) 10 MG tablet Take 1 tablet (10 mg total) by mouth daily.   Blood Glucose Monitoring Suppl (FREESTYLE FREEDOM) KIT 1 Units by Does not apply  route in the morning and at bedtime.   clobetasol ointment (TEMOVATE) 0.05 % Apply topically.   glucose blood test strip Check your sugar in the morning before you eat breakfast, and one hour after a meal.   ibuprofen (ADVIL) 600 MG tablet Take 1 tablet (600 mg total) by mouth every 6 (six) hours as needed.   losartan (COZAAR) 25 MG tablet Take 1 tablet by mouth once daily   metFORMIN (GLUCOPHAGE-XR) 750 MG 24 hr tablet Take 750 mg by mouth daily with breakfast.   metoCLOPramide (REGLAN) 10 MG tablet Take 1 tablet (10 mg total) by mouth every 6 (six) hours.   OZEMPIC, 1 MG/DOSE, 4 MG/3ML SOPN Inject into the skin. [name]   sildenafil (VIAGRA) 100 MG tablet Take by mouth.   SKYRIZI PEN 150 MG/ML SOAJ Inject into the skin.    PHQ 2/9 Scores 05/27/2021 11/22/2020 01/17/2020 12/06/2019  PHQ - 2 Score 0 0 0 0  PHQ- 9 Score 0 0 0 0    GAD 7 : Generalized Anxiety Score 05/27/2021 11/22/2020 01/17/2020 12/06/2019  Nervous, Anxious, on Edge 0 0 0 0  Control/stop worrying 0 0 0 0  Worry too much - different things 0 0 0 0  Trouble relaxing 0 0 0 0  Restless 0 0 0 0  Easily annoyed or irritable 0 0 0 0  Afraid - awful might happen 0 0 0 0  Total GAD 7 Score 0 0 0 0  Anxiety Difficulty Not difficult at all Not difficult at all - -    BP Readings from Last 3 Encounters:  05/27/21 130/80  02/22/21 124/82  01/24/21 120/80    Physical Exam Vitals and nursing note reviewed.  HENT:     Head: Normocephalic.     Right Ear: Tympanic membrane, ear canal and external ear normal.     Left Ear: Tympanic membrane, ear canal and external ear normal.     Nose: Nose normal. No congestion or rhinorrhea.  Eyes:     General: No scleral icterus.       Right eye: No discharge.        Left eye: No discharge.     Conjunctiva/sclera: Conjunctivae normal.     Pupils: Pupils are equal, round, and reactive to light.  Neck:     Thyroid: No thyromegaly.     Vascular: No carotid bruit or JVD.     Trachea: No tracheal  deviation.  Cardiovascular:     Rate and Rhythm: Normal rate and regular rhythm.     Heart sounds: Normal heart sounds, S1 normal and S2 normal. No murmur heard.   No friction rub. No gallop. No S3 or S4 sounds.  Pulmonary:     Effort: No respiratory distress.     Breath sounds: Normal breath sounds. No wheezing, rhonchi or rales.  Abdominal:     General: Bowel sounds are normal.     Palpations: Abdomen is soft. There is no mass.     Tenderness: There is no abdominal tenderness. There is no guarding or rebound.  Musculoskeletal:        General: No tenderness. Normal range of motion.  Cervical back: Normal range of motion and neck supple.  Lymphadenopathy:     Cervical: No cervical adenopathy.  Skin:    General: Skin is warm.     Findings: No rash.  Neurological:     Mental Status: He is alert and oriented to person, place, and time.     Cranial Nerves: No cranial nerve deficit.     Deep Tendon Reflexes: Reflexes are normal and symmetric. Reflexes normal.    Wt Readings from Last 3 Encounters:  05/27/21 286 lb (129.7 kg)  02/22/21 276 lb 14.4 oz (125.6 kg)  01/24/21 277 lb (125.6 kg)    BP 130/80    Pulse 72    Ht '5\' 7"'  (1.702 m)    Wt 286 lb (129.7 kg)    BMI 44.79 kg/m   Assessment and Plan:  1. Primary hypertension Chronic.  Controlled.  Stable.  Blood pressure today is 130/80.  Patient is tolerating medications well and will continue on losartan 25 mg once a day.  We will recheck in 6 months. - losartan (COZAAR) 25 MG tablet; Take 1 tablet (25 mg total) by mouth daily.  Dispense: 90 tablet; Refill: 1  2. Familial hypercholesterolemia Chronic.  Controlled.  Stable.  Review of previous lipid panel is acceptable and will continue atorvastatin 10 mg once a day. - atorvastatin (LIPITOR) 10 MG tablet; Take 1 tablet (10 mg total) by mouth daily.  Dispense: 90 tablet; Refill: 1  3. Colon cancer screening Discussed with patient and patient is referred to gastroenterology  for scheduling of colonoscopy. - Ambulatory referral to Gastroenterology

## 2021-05-27 NOTE — Progress Notes (Signed)
Gastroenterology Pre-Procedure Review  Request Date: 06/21/2021 Requesting Physician: Dr. Allen Norris  PATIENT REVIEW QUESTIONS: The patient responded to the following health history questions as indicated:    1. Are you having any GI issues? no 2. Do you have a personal history of Polyps? no 3. Do you have a family history of Colon Cancer or Polyps? yes (RUNS IN FAMILY) 4. Diabetes Mellitus? yes (TYPE 2) 5. Joint replacements in the past 12 months?no 6. Major health problems in the past 3 months?no 7. Any artificial heart valves, MVP, or defibrillator?no    MEDICATIONS & ALLERGIES:    Patient reports the following regarding taking any anticoagulation/antiplatelet therapy:   Plavix, Coumadin, Eliquis, Xarelto, Lovenox, Pradaxa, Brilinta, or Effient? no Aspirin? no  Patient confirms/reports the following medications:  Current Outpatient Medications  Medication Sig Dispense Refill   atorvastatin (LIPITOR) 10 MG tablet Take 1 tablet (10 mg total) by mouth daily. 90 tablet 1   Blood Glucose Monitoring Suppl (FREESTYLE FREEDOM) KIT 1 Units by Does not apply route in the morning and at bedtime. 1 kit 0   clobetasol ointment (TEMOVATE) 0.05 % Apply topically.     glucose blood test strip Check your sugar in the morning before you eat breakfast, and one hour after a meal. 100 each 2   ibuprofen (ADVIL) 600 MG tablet Take 1 tablet (600 mg total) by mouth every 6 (six) hours as needed. 30 tablet 0   losartan (COZAAR) 25 MG tablet Take 1 tablet (25 mg total) by mouth daily. 90 tablet 1   metFORMIN (GLUCOPHAGE-XR) 750 MG 24 hr tablet Take 750 mg by mouth daily with breakfast.     metoCLOPramide (REGLAN) 10 MG tablet Take 1 tablet (10 mg total) by mouth every 6 (six) hours. 30 tablet 0   OZEMPIC, 1 MG/DOSE, 4 MG/3ML SOPN Inject into the skin. [name]     sildenafil (VIAGRA) 100 MG tablet Take by mouth.     SKYRIZI PEN 150 MG/ML SOAJ Inject into the skin.     No current facility-administered  medications for this visit.    Patient confirms/reports the following allergies:  Allergies  Allergen Reactions   Percocet [Oxycodone-Acetaminophen] Itching    No orders of the defined types were placed in this encounter.   AUTHORIZATION INFORMATION Primary Insurance: 1D#: Group #:  Secondary Insurance: 1D#: Group #:  SCHEDULE INFORMATION: Date: 06/21/2021 Time: Location: [facility]

## 2021-05-28 ENCOUNTER — Telehealth: Payer: Self-pay | Admitting: Family Medicine

## 2021-05-28 ENCOUNTER — Telehealth: Payer: Self-pay

## 2021-05-28 NOTE — Telephone Encounter (Signed)
I called pt and he was unable to get blood work with endo- he will come in next week for A1C, Lipid, CMP and micro

## 2021-05-28 NOTE — Telephone Encounter (Signed)
Copied from CRM [id number]. Topic: General - Other >> May 28, 2021  3:17 PM Pawlus, Maxine Glenn A wrote: Reason for CRM: Pt had an appt 1/9 and had some follow up questions regarding bloodwork, pt was not sure if he needs to have them done this month, please advise.

## 2021-06-04 ENCOUNTER — Encounter: Payer: Self-pay | Admitting: Gastroenterology

## 2021-06-06 ENCOUNTER — Ambulatory Visit: Payer: BC Managed Care – PPO

## 2021-06-06 DIAGNOSIS — I10 Essential (primary) hypertension: Secondary | ICD-10-CM

## 2021-06-06 DIAGNOSIS — E119 Type 2 diabetes mellitus without complications: Secondary | ICD-10-CM | POA: Diagnosis not present

## 2021-06-06 DIAGNOSIS — E7801 Familial hypercholesterolemia: Secondary | ICD-10-CM | POA: Diagnosis not present

## 2021-06-07 LAB — COMPREHENSIVE METABOLIC PANEL
ALT: 20 IU/L (ref 0–44)
AST: 19 IU/L (ref 0–40)
Albumin/Globulin Ratio: 2 (ref 1.2–2.2)
Albumin: 4.7 g/dL (ref 4.0–5.0)
Alkaline Phosphatase: 64 IU/L (ref 44–121)
BUN/Creatinine Ratio: 20 (ref 9–20)
BUN: 16 mg/dL (ref 6–24)
Bilirubin Total: 0.7 mg/dL (ref 0.0–1.2)
CO2: 25 mmol/L (ref 20–29)
Calcium: 9.5 mg/dL (ref 8.7–10.2)
Chloride: 102 mmol/L (ref 96–106)
Creatinine, Ser: 0.81 mg/dL (ref 0.76–1.27)
Globulin, Total: 2.4 g/dL (ref 1.5–4.5)
Glucose: 113 mg/dL — ABNORMAL HIGH (ref 70–99)
Potassium: 4.5 mmol/L (ref 3.5–5.2)
Sodium: 141 mmol/L (ref 134–144)
Total Protein: 7.1 g/dL (ref 6.0–8.5)
eGFR: 109 mL/min/{1.73_m2} (ref >59)

## 2021-06-07 LAB — LIPID PANEL WITH LDL/HDL RATIO
Cholesterol, Total: 123 mg/dL (ref 100–199)
HDL: 42 mg/dL (ref >39)
LDL Chol Calc (NIH): 69 mg/dL (ref 0–99)
LDL/HDL Ratio: 1.6 ratio (ref 0.0–3.6)
Triglycerides: 56 mg/dL (ref 0–149)
VLDL Cholesterol Cal: 12 mg/dL (ref 5–40)

## 2021-06-07 LAB — HEMOGLOBIN A1C
Est. average glucose Bld gHb Est-mCnc: 140 mg/dL
Hgb A1c MFr Bld: 6.5 % — ABNORMAL HIGH (ref 4.8–5.6)

## 2021-06-07 LAB — MICROALBUMIN, URINE: Microalbumin, Urine: 11.1 ug/mL

## 2021-06-10 ENCOUNTER — Other Ambulatory Visit: Payer: Self-pay | Admitting: Family Medicine

## 2021-06-11 NOTE — Telephone Encounter (Signed)
Requested medications are due for refill today.  unsure  Requested medications are on the active medications list.  yes  Last refill. 01/17/2020  Future visit scheduled.   yes  Notes to clinic.  Historical medication/provider.    Requested Prescriptions  Pending Prescriptions Disp Refills   sildenafil (VIAGRA) 100 MG tablet [Pharmacy Med Name: Sildenafil Citrate 100 MG Oral Tablet] 5 tablet 0    Sig: TAKE 1/2 TO 1 (ONE-HALF TO ONE) TABLET BY MOUTH ONCE DAILY AS NEEDED FOR ERECTILE DYSFUNCTION     Urology: Erectile Dysfunction Agents Passed - 06/10/2021  9:16 PM      Passed - Last BP in normal range    BP Readings from Last 1 Encounters:  05/27/21 130/80          Passed - Valid encounter within last 12 months    Recent Outpatient Visits           2 weeks ago Primary hypertension   [facility] Duanne Limerick, MD   4 months ago Primary hypertension   [facility] Duanne Limerick, MD   6 months ago Primary hypertension   [facility] Duanne Limerick, MD   1 year ago Type 2 diabetes mellitus without complication, without long-term current use of insulin (HCC)   [facility] Duanne Limerick, MD   1 year ago Right ankle pain, unspecified chronicity   [facility] Duanne Limerick, MD       Future Appointments             In 5 months Duanne Limerick, MD [facility], [facility]

## 2021-06-20 ENCOUNTER — Other Ambulatory Visit: Payer: Self-pay

## 2021-06-20 MED ORDER — PEG 3350-KCL-NA BICARB-NACL 420 G PO SOLR: 4000.0000 mL | Freq: Once | ORAL | 0 refills | Status: AC

## 2021-06-20 NOTE — Progress Notes (Signed)
Patient states he does not have the prep and needs it sent to [facility]

## 2021-06-21 ENCOUNTER — Ambulatory Visit
Admission: RE | Admit: 2021-06-21 | Discharge: 2021-06-21 | Disposition: A | Discharge status: Home / Self Care | Payer: BC Managed Care – PPO | Attending: Gastroenterology | Admitting: Gastroenterology

## 2021-06-21 ENCOUNTER — Encounter
Admission: RE | Disposition: A | Discharge status: Home / Self Care | Payer: Self-pay | Source: Home / Self Care | Attending: Gastroenterology

## 2021-06-21 ENCOUNTER — Ambulatory Visit: Payer: BC Managed Care – PPO | Admitting: Anesthesiology

## 2021-06-21 ENCOUNTER — Encounter: Payer: Self-pay | Admitting: Gastroenterology

## 2021-06-21 ENCOUNTER — Other Ambulatory Visit: Payer: Self-pay

## 2021-06-21 DIAGNOSIS — I10 Essential (primary) hypertension: Secondary | ICD-10-CM | POA: Insufficient documentation

## 2021-06-21 DIAGNOSIS — K5730 Diverticulosis of large intestine without perforation or abscess without bleeding: Secondary | ICD-10-CM | POA: Insufficient documentation

## 2021-06-21 DIAGNOSIS — Z1211 Encounter for screening for malignant neoplasm of colon: Secondary | ICD-10-CM | POA: Diagnosis not present

## 2021-06-21 DIAGNOSIS — Z6841 Body Mass Index (BMI) 40.0 and over, adult: Secondary | ICD-10-CM | POA: Diagnosis not present

## 2021-06-21 DIAGNOSIS — E669 Obesity, unspecified: Secondary | ICD-10-CM | POA: Diagnosis not present

## 2021-06-21 DIAGNOSIS — K648 Other hemorrhoids: Secondary | ICD-10-CM | POA: Diagnosis not present

## 2021-06-21 DIAGNOSIS — E119 Type 2 diabetes mellitus without complications: Secondary | ICD-10-CM | POA: Insufficient documentation

## 2021-06-21 DIAGNOSIS — Z87891 Personal history of nicotine dependence: Secondary | ICD-10-CM | POA: Insufficient documentation

## 2021-06-21 HISTORY — PX: COLONOSCOPY WITH PROPOFOL: SHX5780

## 2021-06-21 HISTORY — DX: Essential (primary) hypertension: I10

## 2021-06-21 LAB — GLUCOSE, CAPILLARY
Glucose-Capillary: 113 mg/dL — ABNORMAL HIGH (ref 70–99)
Glucose-Capillary: 101 mg/dL — ABNORMAL HIGH (ref 70–99)

## 2021-06-21 SURGERY — COLONOSCOPY WITH PROPOFOL
Anesthesia: General

## 2021-06-21 MED ORDER — PROPOFOL 10 MG/ML IV BOLUS
INTRAVENOUS | Status: DC | PRN
Start: 2021-06-21 — End: 2021-06-21
  Administered 2021-06-21: 20 mg via INTRAVENOUS
  Administered 2021-06-21: 30 mg via INTRAVENOUS
  Administered 2021-06-21 (×2): 50 mg via INTRAVENOUS
  Administered 2021-06-21 (×2): 20 mg via INTRAVENOUS

## 2021-06-21 MED ORDER — ONDANSETRON HCL 4 MG/2ML IJ SOLN: 4.0000 mg | Freq: Once | INTRAMUSCULAR | Status: DC | PRN

## 2021-06-21 MED ORDER — STERILE WATER FOR IRRIGATION IR SOLN
Status: DC | PRN
  Administered 2021-06-21: 500 mL

## 2021-06-21 MED ORDER — LACTATED RINGERS IV SOLN: INTRAVENOUS | Status: DC

## 2021-06-21 MED ORDER — SODIUM CHLORIDE 0.9 % IV SOLN: INTRAVENOUS | Status: DC

## 2021-06-21 MED ORDER — ACETAMINOPHEN 10 MG/ML IV SOLN: 1000.0000 mg | Freq: Once | INTRAVENOUS | Status: DC | PRN

## 2021-06-21 SURGICAL SUPPLY — 22 items

## 2021-06-21 NOTE — Op Note (Signed)
[facility] Patient Name: Brandon Green Procedure Date: 06/21/2021 8:39 AM MRN: [id number] Account #: [id number] Date of Birth: 15-Feb-1971 Admit Type: Outpatient Age: 51 Room: [facility] OR ROOM 01 Gender: Male Note Status: Finalized Instrument Name: [id number] Procedure:             Colonoscopy Indications:           Screening for colorectal malignant neoplasm Providers:             Midge Minium MD, MD Referring MD:          Duanne Limerick, MD (Referring MD) Medicines:             Propofol per Anesthesia Complications:         No immediate complications. Procedure:             Pre-Anesthesia Assessment:                        - Prior to the procedure, a History and Physical was                         performed, and patient medications and allergies were                         reviewed. The patient's tolerance of previous                         anesthesia was also reviewed. The risks and benefits                         of the procedure and the sedation options and risks                         were discussed with the patient. All questions were                         answered, and informed consent was obtained. Prior                         Anticoagulants: The patient has taken no previous                         anticoagulant or antiplatelet agents. ASA Grade                         Assessment: II - A patient with mild systemic disease.                         After reviewing the risks and benefits, the patient                         was deemed in satisfactory condition to undergo the                         procedure.                        After obtaining informed consent, the colonoscope was  passed under direct vision. Throughout the procedure,                         the patient's blood pressure, pulse, and oxygen                         saturations were monitored continuously. The                         Colonoscope was  introduced through the anus and                         advanced to the the cecum, identified by appendiceal                         orifice and ileocecal valve. The colonoscopy was                         performed without difficulty. The patient tolerated                         the procedure well. The quality of the bowel                         preparation was excellent. Findings:      The perianal and digital rectal examinations were normal.      A few small-mouthed diverticula were found in the sigmoid colon.      Non-bleeding internal hemorrhoids were found during retroflexion. The       hemorrhoids were Grade I (internal hemorrhoids that do not prolapse). Impression:            - Diverticulosis in the sigmoid colon.                        - Non-bleeding internal hemorrhoids.                        - No specimens collected. Recommendation:        - Discharge patient to home.                        - Resume previous diet.                        - Continue present medications.                        - Repeat colonoscopy in 10 years for screening                         purposes. Procedure Code(s):     --- Professional ---                        [phone], Colonoscopy, flexible; diagnostic, including                         collection of specimen(s) by brushing or washing, when                         performed (separate procedure)  Diagnosis Code(s):     --- Professional ---                        Z12.11, Encounter for screening for malignant neoplasm                         of colon CPT copyright 2019 American Medical Association. All rights reserved. The codes documented in this report are preliminary and upon coder review may  be revised to meet current compliance requirements. Midge Miniumarren Calil Amor MD, MD 06/21/2021 9:06:15 AM This report has been signed electronically. Number of Addenda: 0 Note Initiated On: 06/21/2021 8:39 AM Scope Withdrawal Time: 0 hours 11 minutes 29 seconds  Total  Procedure Duration: 0 hours 14 minutes 29 seconds  Estimated Blood Loss:  Estimated blood loss: none.      [facility]

## 2021-06-21 NOTE — H&P (Signed)
Brandon Lame, MD [address] [name], [name] [address] Phone: [phone] Fax : [phone]  Primary Care Physician:  Juline Patch, MD Primary Gastroenterologist:  Dr. Allen Norris  Pre-Procedure History & Physical: HPI:  Brandon Green is a 51 y.o. male is here for a screening colonoscopy.   Past Medical History:  Diagnosis Date   Diabetes mellitus without complication ([place])    Hypertension    Psoriasis     Past Surgical History:  Procedure Laterality Date   FRACTURE SURGERY     right lower leg    Prior to Admission medications   Medication Sig Start Date End Date Taking? Authorizing Provider  atorvastatin (LIPITOR) 10 MG tablet Take 1 tablet (10 mg total) by mouth daily. 05/27/21  Yes Juline Patch, MD  ibuprofen (ADVIL) 600 MG tablet Take 1 tablet (600 mg total) by mouth every 6 (six) hours as needed. 04/16/19  Yes Melynda Ripple, MD  losartan (COZAAR) 25 MG tablet Take 1 tablet (25 mg total) by mouth daily. 05/27/21  Yes Juline Patch, MD  metFORMIN (GLUCOPHAGE-XR) 750 MG 24 hr tablet Take 750 mg by mouth daily with breakfast.   Yes [provider]  metoCLOPramide (REGLAN) 10 MG tablet Take 1 tablet (10 mg total) by mouth every 6 (six) hours. 02/22/21  Yes Margarette Canada, NP  OZEMPIC, 1 MG/DOSE, 4 MG/3ML SOPN Inject into the skin. [name] 12/27/20  Yes [provider]  sildenafil (VIAGRA) 100 MG tablet Take by mouth. 01/17/20  Yes [provider]  SKYRIZI PEN 150 MG/ML SOAJ Inject into the skin. 12/28/20  Yes [provider]  Blood Glucose Monitoring Suppl (FREESTYLE FREEDOM) KIT 1 Units by Does not apply route in the morning and at bedtime. 10/25/19   Melynda Ripple, MD  clobetasol ointment (TEMOVATE) 0.05 % Apply topically.    [provider]  glucose blood test strip Check your sugar in the morning before you eat breakfast, and one hour after a meal. 10/25/19   Melynda Ripple, MD    Allergies as of 05/27/2021 -  Review Complete 05/27/2021  Allergen Reaction Noted   Percocet [oxycodone-acetaminophen] Itching 05/17/2018    Family History  Problem Relation Age of Onset   Diabetes Mother    Diabetes Brother    Cancer Maternal Aunt    Heart disease Maternal Uncle    Stroke Maternal Uncle    Cancer Maternal Grandfather    Heart disease Maternal Grandfather    Hypertension Maternal Grandfather     Social History   Socioeconomic History   Marital status: Married    Spouse name: Not on file   Number of children: Not on file   Years of education: Not on file   Highest education level: Not on file  Occupational History   Not on file  Tobacco Use   Smoking status: Former    Packs/day: 0.25    Types: Cigarettes    Quit date: 06/19/2020    Years since quitting: 1.0   Smokeless tobacco: Never  Vaping Use   Vaping Use: Never used  Substance and Sexual Activity   Alcohol use: Not Currently   Drug use: Never   Sexual activity: Yes  Other Topics Concern   Not on file  Social History Narrative   Not on file   Social Determinants of Health   Financial Resource Strain: Not on file  Food Insecurity: Not on file  Transportation Needs: Not on file  Physical Activity: Not on file  Stress: Not on file  Social Connections: Not on file  Intimate Partner Violence: Not on file    Review of Systems: See HPI, otherwise negative ROS  Physical Exam: Ht 5' 7" (1.702 m)    Wt 127 kg    BMI 43.85 kg/m  General:   Alert,  pleasant and cooperative in NAD Head:  Normocephalic and atraumatic. Neck:  Supple; no masses or thyromegaly. Lungs:  Clear throughout to auscultation.    Heart:  Regular rate and rhythm. Abdomen:  Soft, nontender and nondistended. Normal bowel sounds, without guarding, and without rebound.   Neurologic:  Alert and  oriented x4;  grossly normal neurologically.  Impression/Plan: Brandon Green is now here to undergo a screening colonoscopy.  Risks, benefits, and alternatives  regarding colonoscopy have been reviewed with the patient.  Questions have been answered.  All parties agreeable.

## 2021-06-21 NOTE — Anesthesia Postprocedure Evaluation (Signed)
Anesthesia Post Note  Patient: Brandon Green  Procedure(s) Performed: COLONOSCOPY WITH PROPOFOL     Patient location during evaluation: PACU Anesthesia Type: General Level of consciousness: awake and alert Pain management: pain level controlled Vital Signs Assessment: post-procedure vital signs reviewed and stable Respiratory status: spontaneous breathing, nonlabored ventilation, respiratory function stable and patient connected to nasal cannula oxygen Cardiovascular status: blood pressure returned to baseline and stable Postop Assessment: no apparent nausea or vomiting Anesthetic complications: no   No notable events documented.  Luretta Everly A  Tenleigh Byer

## 2021-06-21 NOTE — Anesthesia Preprocedure Evaluation (Addendum)
Anesthesia Evaluation  Patient identified by MRN, date of birth, ID band Patient awake    Reviewed: Allergy & Precautions, NPO status , Patient's Chart, lab work & pertinent test results, reviewed documented beta blocker date and time   History of Anesthesia Complications Negative for: history of anesthetic complications  Airway Mallampati: III  TM Distance: >3 FB Neck ROM: Full    Dental   Pulmonary former smoker,    breath sounds clear to auscultation       Cardiovascular hypertension, (-) angina(-) DOE  Rhythm:Regular Rate:Normal     Neuro/Psych    GI/Hepatic neg GERD  ,  Endo/Other  diabetes  Renal/GU      Musculoskeletal   Abdominal (+) + obese (BMI 44),   Peds  Hematology   Anesthesia Other Findings   Reproductive/Obstetrics                            Anesthesia Physical Anesthesia Plan  ASA: 3  Anesthesia Plan: General   Post-op Pain Management:    Induction: Intravenous  PONV Risk Score and Plan: 2 and Propofol infusion, TIVA and Treatment may vary due to age or medical condition  Airway Management Planned: Natural Airway and Nasal Cannula  Additional Equipment:   Intra-op Plan:   Post-operative Plan:   Informed Consent: I have reviewed the patients History and Physical, chart, labs and discussed the procedure including the risks, benefits and alternatives for the proposed anesthesia with the patient or authorized representative who has indicated his/her understanding and acceptance.       Plan Discussed with: CRNA and Anesthesiologist  Anesthesia Plan Comments:         Anesthesia Quick Evaluation

## 2021-06-21 NOTE — Transfer of Care (Signed)
Immediate Anesthesia Transfer of Care Note  Patient: Brandon Green  Procedure(s) Performed: COLONOSCOPY WITH PROPOFOL  Patient Location: PACU  Anesthesia Type: General  Level of Consciousness: awake, alert  and patient cooperative  Airway and Oxygen Therapy: Patient Spontanous Breathing and Patient connected to supplemental oxygen  Post-op Assessment: Post-op Vital signs reviewed, Patient's Cardiovascular Status Stable, Respiratory Function Stable, Patent Airway and No signs of Nausea or vomiting  Post-op Vital Signs: Reviewed and stable  Complications: No notable events documented.

## 2021-06-24 DIAGNOSIS — Z79899 Other long term (current) drug therapy: Secondary | ICD-10-CM | POA: Diagnosis not present

## 2021-06-24 DIAGNOSIS — L400 Psoriasis vulgaris: Secondary | ICD-10-CM | POA: Diagnosis not present

## 2021-06-24 NOTE — Telephone Encounter (Signed)
Pt called to report that his PCP has prescribed this before with no problem, please advise. Pt is completely out of his current supply.

## 2021-06-24 NOTE — Addendum Note (Signed)
Addended by: Erick Colace on: 06/24/2021 07:15 PM   Modules accepted: Orders

## 2021-06-24 NOTE — Telephone Encounter (Signed)
Requested medication (s) are due for refill today: unknown  Requested medication (s) are on the active medication list: yes by historical provider  Last refill:  01/17/20 unknown amt  Future visit scheduled: 11/25/21  Notes to clinic:  new pt with Dr. Yetta Barre, this is old rx from historical provider, please assess.  Requested Prescriptions  Pending Prescriptions Disp Refills   sildenafil (VIAGRA) 100 MG tablet 10 tablet     Sig: Take by mouth.     Urology: Erectile Dysfunction Agents Passed - 06/24/2021  7:15 PM      Passed - AST in normal range and within 360 days    AST  Date Value Ref Range Status  06/06/2021 19 0 - 40 IU/L Final          Passed - ALT in normal range and within 360 days    ALT  Date Value Ref Range Status  06/06/2021 20 0 - 44 IU/L Final          Passed - Last BP in normal range    BP Readings from Last 1 Encounters:  06/21/21 106/68          Passed - Valid encounter within last 12 months    Recent Outpatient Visits           4 weeks ago Primary hypertension   [facility] Duanne Limerick, MD   5 months ago Primary hypertension   [facility] Duanne Limerick, MD   7 months ago Primary hypertension   [facility] Duanne Limerick, MD   1 year ago Type 2 diabetes mellitus without complication, without long-term current use of insulin (HCC)   [facility] Duanne Limerick, MD   1 year ago Right ankle pain, unspecified chronicity   [facility] Duanne Limerick, MD       Future Appointments             In 5 months Duanne Limerick, MD [facility], [facility]            Refused Prescriptions Disp Refills   sildenafil (VIAGRA) 100 MG tablet [Pharmacy Med Name: Sildenafil Citrate 100 MG Oral Tablet] 5 tablet 0    Sig: TAKE 1/2 TO 1 (ONE-HALF TO ONE) TABLET BY MOUTH ONCE DAILY AS NEEDED FOR ERECTILE DYSFUNCTION     Urology: Erectile Dysfunction Agents Passed - 06/24/2021  7:15 PM      Passed - AST in  normal range and within 360 days    AST  Date Value Ref Range Status  06/06/2021 19 0 - 40 IU/L Final          Passed - ALT in normal range and within 360 days    ALT  Date Value Ref Range Status  06/06/2021 20 0 - 44 IU/L Final          Passed - Last BP in normal range    BP Readings from Last 1 Encounters:  06/21/21 106/68          Passed - Valid encounter within last 12 months    Recent Outpatient Visits           4 weeks ago Primary hypertension   [facility] Duanne Limerick, MD   5 months ago Primary hypertension   [facility] Duanne Limerick, MD   7 months ago Primary hypertension   [facility] Duanne Limerick, MD   1 year ago Type  2 diabetes mellitus without complication, without long-term current use of insulin (HCC)   [facility] Duanne Limerick, MD   1 year ago Right ankle pain, unspecified chronicity   [facility] Duanne Limerick, MD       Future Appointments             In 5 months Duanne Limerick, MD [facility], [facility]

## 2021-07-08 ENCOUNTER — Other Ambulatory Visit: Payer: Self-pay | Admitting: Family Medicine

## 2021-07-08 NOTE — Telephone Encounter (Signed)
Copied from CRM [id number]. Topic: Quick Communication - Rx Refill/Question >> Jul 08, 2021  4:44 PM Gaetana Michaelis A wrote: Medication: sildenafil (VIAGRA) 100 MG tablet [[id number]]  Has the patient contacted their pharmacy? Yes.  The patient has been directed to contact their PCP (Agent: If no, request that the patient contact the pharmacy for the refill. If patient does not wish to contact the pharmacy document the reason why and proceed with request.) (Agent: If yes, when and what did the pharmacy advise?)  Preferred Pharmacy (with phone number or street name): [facility] [address] [place] - [address] [place] [address] Phone: [phone] Fax: [phone]  Has the patient been seen for an appointment in the last year OR does the patient have an upcoming appointment? Yes.    Agent: Please be advised that RX refills may take up to 3 business days. We ask that you follow-up with your pharmacy.

## 2021-07-09 ENCOUNTER — Other Ambulatory Visit: Payer: Self-pay

## 2021-07-09 DIAGNOSIS — N521 Erectile dysfunction due to diseases classified elsewhere: Secondary | ICD-10-CM

## 2021-07-09 DIAGNOSIS — E1169 Type 2 diabetes mellitus with other specified complication: Secondary | ICD-10-CM | POA: Diagnosis not present

## 2021-07-09 DIAGNOSIS — E1165 Type 2 diabetes mellitus with hyperglycemia: Secondary | ICD-10-CM | POA: Diagnosis not present

## 2021-07-09 DIAGNOSIS — I10 Essential (primary) hypertension: Secondary | ICD-10-CM | POA: Diagnosis not present

## 2021-07-09 DIAGNOSIS — F17200 Nicotine dependence, unspecified, uncomplicated: Secondary | ICD-10-CM | POA: Diagnosis not present

## 2021-07-09 MED ORDER — SILDENAFIL CITRATE 100 MG PO TABS: 100.0000 mg | ORAL_TABLET | ORAL | 0 refills | Status: DC | PRN

## 2021-07-09 NOTE — Telephone Encounter (Signed)
Requested medication (s) are due for refill today: yes  Requested medication (s) are on the active medication list: yes  Last refill:  12/2019  Future visit scheduled: yes  Notes to clinic:  historical med. Please advise     Requested Prescriptions  Pending Prescriptions Disp Refills   sildenafil (VIAGRA) 100 MG tablet 10 tablet     Sig: Take by mouth.     Urology: Erectile Dysfunction Agents Passed - 07/08/2021  5:30 PM      Passed - AST in normal range and within 360 days    AST  Date Value Ref Range Status  06/06/2021 19 0 - 40 IU/L Final          Passed - ALT in normal range and within 360 days    ALT  Date Value Ref Range Status  06/06/2021 20 0 - 44 IU/L Final          Passed - Last BP in normal range    BP Readings from Last 1 Encounters:  06/21/21 106/68          Passed - Valid encounter within last 12 months    Recent Outpatient Visits           1 month ago Primary hypertension   [facility] Duanne Limerick, MD   5 months ago Primary hypertension   [facility] Duanne Limerick, MD   7 months ago Primary hypertension   [facility] Duanne Limerick, MD   1 year ago Type 2 diabetes mellitus without complication, without long-term current use of insulin (HCC)   [facility] Duanne Limerick, MD   1 year ago Right ankle pain, unspecified chronicity   [facility] Duanne Limerick, MD       Future Appointments             In 4 months Duanne Limerick, MD [facility], [facility]

## 2021-07-09 NOTE — Telephone Encounter (Signed)
Requested medication (s) are due for refill today: Unclear  Requested medication (s) are on the active medication list: from historical provider  Last refill:  01/16/2021   Future visit scheduled: 11/25/21  Notes to clinic:  Historical Provider, please assess.  Requested Prescriptions  Pending Prescriptions Disp Refills   sildenafil (VIAGRA) 100 MG tablet [Pharmacy Med Name: Sildenafil Citrate 100 MG Oral Tablet] 5 tablet 0    Sig: TAKE 1/2 TO 1 (ONE-HALF TO ONE) TABLET BY MOUTH ONCE DAILY AS NEEDED FOR ERECTILE DYSFUNCTION     Urology: Erectile Dysfunction Agents Passed - 07/08/2021  4:34 PM      Passed - AST in normal range and within 360 days    AST  Date Value Ref Range Status  06/06/2021 19 0 - 40 IU/L Final          Passed - ALT in normal range and within 360 days    ALT  Date Value Ref Range Status  06/06/2021 20 0 - 44 IU/L Final          Passed - Last BP in normal range    BP Readings from Last 1 Encounters:  06/21/21 106/68          Passed - Valid encounter within last 12 months    Recent Outpatient Visits           1 month ago Primary hypertension   [facility] Duanne Limerick, MD   5 months ago Primary hypertension   [facility] Duanne Limerick, MD   7 months ago Primary hypertension   [facility] Duanne Limerick, MD   1 year ago Type 2 diabetes mellitus without complication, without long-term current use of insulin (HCC)   [facility] Duanne Limerick, MD   1 year ago Right ankle pain, unspecified chronicity   [facility] Duanne Limerick, MD       Future Appointments             In 4 months Duanne Limerick, MD [facility], [facility]

## 2021-08-28 ENCOUNTER — Telehealth: Payer: Self-pay | Admitting: Family Medicine

## 2021-08-28 DIAGNOSIS — E7801 Familial hypercholesterolemia: Secondary | ICD-10-CM

## 2021-08-28 MED ORDER — ATORVASTATIN CALCIUM 10 MG PO TABS: 10.0000 mg | ORAL_TABLET | Freq: Every day | ORAL | 0 refills | Status: DC

## 2021-08-28 NOTE — Telephone Encounter (Signed)
Medication Refill - Medication: atorvastatin (LIPITOR) 10 MG tablet,sildenafil (VIAGRA) 100 MG tablet ? ?Pt stated only has two tablets left of atorvastatin. ? ?Has the patient contacted their pharmacy? Yes.   No, more refills.  ? ?(Agent: If yes, when and what did the pharmacy advise?) ? ?Preferred Pharmacy (with phone number or street name):  ?[facility], [place] - [address]  ?[place] [address]  ?Phone: [phone] Fax: [phone]  ?Hours: Not open 24 hours  ? ?Has the patient been seen for an appointment in the last year OR does the patient have an upcoming appointment? Yes.   ? ?Agent: Please be advised that RX refills may take up to 3 business days. We ask that you follow-up with your pharmacy.  ?

## 2021-08-28 NOTE — Telephone Encounter (Signed)
Requested Prescriptions  ?Pending Prescriptions Disp Refills  ?? atorvastatin (LIPITOR) 10 MG tablet 90 tablet 1  ?  Sig: Take 1 tablet (10 mg total) by mouth daily.  ?  ? Cardiovascular:  Antilipid - Statins Failed - 08/28/2021  4:20 PM  ?  ?  Failed - Lipid Panel in normal range within the last 12 months  ?  Cholesterol, Total  ?Date Value Ref Range Status  ?06/06/2021 123 100 - 199 mg/dL Final  ? ?LDL Chol Calc (NIH)  ?Date Value Ref Range Status  ?06/06/2021 69 0 - 99 mg/dL Final  ? ?HDL  ?Date Value Ref Range Status  ?06/06/2021 42 >39 mg/dL Final  ? ?Triglycerides  ?Date Value Ref Range Status  ?06/06/2021 56 0 - 149 mg/dL Final  ? ?  ?  ?  Passed - Patient is not pregnant  ?  ?  Passed - Valid encounter within last 12 months  ?  Recent Outpatient Visits   ?      ? 3 months ago Primary hypertension  ? [facility] Duanne Limerick, MD  ? 7 months ago Primary hypertension  ? [facility] Duanne Limerick, MD  ? 9 months ago Primary hypertension  ? [facility] Duanne Limerick, MD  ? 1 year ago Type 2 diabetes mellitus without complication, without long-term current use of insulin (HCC)  ? [facility] Duanne Limerick, MD  ? 1 year ago Right ankle pain, unspecified chronicity  ? [facility] Duanne Limerick, MD  ?  ?  ?Future Appointments   ?        ? In 2 months Duanne Limerick, MD [facility], [facility]  ?  ? ?  ?  ?  ? ? ?

## 2021-09-13 ENCOUNTER — Other Ambulatory Visit: Payer: Self-pay

## 2021-09-13 DIAGNOSIS — N521 Erectile dysfunction due to diseases classified elsewhere: Secondary | ICD-10-CM

## 2021-09-13 MED ORDER — SILDENAFIL CITRATE 100 MG PO TABS: 100.0000 mg | ORAL_TABLET | ORAL | 0 refills | Status: DC | PRN

## 2021-09-13 NOTE — Telephone Encounter (Addendum)
Patient checking on the status of his sildenafil (VIAGRA) 100 MG tablet refill. Patient states he was able to pick up atorvastatin (LIPITOR) 10 MG tablet and is out of the sildenafil (VIAGRA) 100 MG tablet ? ? ?[facility] [address], [place] - [address] Phone:  [phone]  ?Fax:  [phone]  ?  ? ?

## 2021-10-08 ENCOUNTER — Other Ambulatory Visit: Payer: Self-pay | Admitting: Family Medicine

## 2021-10-08 DIAGNOSIS — N521 Erectile dysfunction due to diseases classified elsewhere: Secondary | ICD-10-CM

## 2021-10-08 NOTE — Telephone Encounter (Unsigned)
Copied from CRM [id number]. Topic: General - Other >> Oct 08, 2021  2:49 PM Gaetana Michaelis A wrote: Reason for CRM: Medication Refill - Medication: sildenafil (VIAGRA) 100 MG tablet [[id number]]   Has the patient contacted their pharmacy? Yes.  The patient has been directed to contact their PCP (Agent: If no, request that the patient contact the pharmacy for the refill. If patient does not wish to contact the pharmacy document the reason why and proceed with request.) (Agent: If yes, when and what did the pharmacy advise?)  Preferred Pharmacy (with phone number or street name): [facility] [address] [place] - [address] [facility] [address] [place] [address] Phone: [phone] Fax: [phone] Hours: Not open 24 hours   Has the patient been seen for an appointment in the last year OR does the patient have an upcoming appointment? Yes.     Agent: Please be advised that RX refills may take up to 3 business days. We ask that you follow-up with your pharmacy.

## 2021-10-09 MED ORDER — SILDENAFIL CITRATE 100 MG PO TABS: 100.0000 mg | ORAL_TABLET | ORAL | 0 refills | Status: DC | PRN

## 2021-10-09 NOTE — Telephone Encounter (Signed)
Requested Prescriptions  Pending Prescriptions Disp Refills  . sildenafil (VIAGRA) 100 MG tablet 10 tablet 0    Sig: Take 1 tablet (100 mg total) by mouth as needed for erectile dysfunction.     Urology: Erectile Dysfunction Agents Passed - 10/08/2021  4:37 PM      Passed - AST in normal range and within 360 days    AST  Date Value Ref Range Status  06/06/2021 19 0 - 40 IU/L Final         Passed - ALT in normal range and within 360 days    ALT  Date Value Ref Range Status  06/06/2021 20 0 - 44 IU/L Final         Passed - Last BP in normal range    BP Readings from Last 1 Encounters:  06/21/21 106/68         Passed - Valid encounter within last 12 months    Recent Outpatient Visits          4 months ago Primary hypertension   [facility] Duanne Limerick, MD   8 months ago Primary hypertension   [facility] Duanne Limerick, MD   10 months ago Primary hypertension   [facility] Duanne Limerick, MD   1 year ago Type 2 diabetes mellitus without complication, without long-term current use of insulin (HCC)   [facility] Duanne Limerick, MD   1 year ago Right ankle pain, unspecified chronicity   [facility] Duanne Limerick, MD      Future Appointments            In 1 month Duanne Limerick, MD [facility], [facility]

## 2021-11-25 ENCOUNTER — Ambulatory Visit: Payer: BC Managed Care – PPO | Admitting: Family Medicine

## 2021-12-06 ENCOUNTER — Ambulatory Visit: Payer: Self-pay | Admitting: Family Medicine

## 2021-12-20 ENCOUNTER — Ambulatory Visit: Payer: Self-pay | Admitting: Family Medicine

## 2022-01-01 ENCOUNTER — Ambulatory Visit: Payer: Self-pay | Admitting: Family Medicine

## 2022-01-03 ENCOUNTER — Ambulatory Visit: Payer: BC Managed Care – PPO | Admitting: Family Medicine

## 2022-01-03 ENCOUNTER — Encounter: Payer: Self-pay | Admitting: Family Medicine

## 2022-01-03 VITALS — BP 120/70 | HR 64 | Ht 67.0 in | Wt 288.0 lb

## 2022-01-03 DIAGNOSIS — E7801 Familial hypercholesterolemia: Secondary | ICD-10-CM

## 2022-01-03 DIAGNOSIS — I10 Essential (primary) hypertension: Secondary | ICD-10-CM | POA: Diagnosis not present

## 2022-01-03 DIAGNOSIS — E119 Type 2 diabetes mellitus without complications: Secondary | ICD-10-CM | POA: Diagnosis not present

## 2022-01-03 DIAGNOSIS — N521 Erectile dysfunction due to diseases classified elsewhere: Secondary | ICD-10-CM

## 2022-01-03 MED ORDER — SILDENAFIL CITRATE 100 MG PO TABS: 100.0000 mg | ORAL_TABLET | ORAL | 0 refills | Status: DC | PRN

## 2022-01-03 MED ORDER — LOSARTAN POTASSIUM 25 MG PO TABS: 25.0000 mg | ORAL_TABLET | Freq: Every day | ORAL | 1 refills | Status: DC

## 2022-01-03 MED ORDER — ATORVASTATIN CALCIUM 10 MG PO TABS: 10.0000 mg | ORAL_TABLET | Freq: Every day | ORAL | 0 refills | Status: DC

## 2022-01-03 NOTE — Progress Notes (Signed)
Date:  01/03/2022   Name:  Brandon Green   DOB:  [birth year]   MRN:  [id number]   Chief Complaint: Diabetes, Erectile Dysfunction, Hypertension, and Hyperlipidemia  Diabetes He presents for his follow-up diabetic visit. He has type 2 diabetes mellitus. His disease course has been stable. There are no hypoglycemic associated symptoms. Pertinent negatives for hypoglycemia include no dizziness, headaches, nervousness/anxiousness or sweats. There are no diabetic associated symptoms. Pertinent negatives for diabetes include no blurred vision, no chest pain and no polydipsia. There are no hypoglycemic complications. Symptoms are stable. Pertinent negatives for diabetic complications include no CVA, PVD or retinopathy. Risk factors for coronary artery disease include dyslipidemia and hypertension. Current diabetic treatments: ozempic. He is compliant with treatment some of the time. He is following a generally healthy diet. An ACE inhibitor/angiotensin II receptor blocker is being taken.  Erectile Dysfunction This is a chronic problem. The current episode started more than 1 year ago. The problem has been gradually improving since onset. He reports no anxiety. Irritative symptoms do not include frequency, nocturia or urgency. Pertinent negatives include no chills, dysuria or hematuria. Past treatments include sildenafil. The treatment provided moderate relief.  Hypertension This is a chronic problem. The current episode started more than 1 year ago. The problem has been gradually improving since onset. The problem is controlled. Pertinent negatives include no anxiety, blurred vision, chest pain, headaches, malaise/fatigue, neck pain, orthopnea, palpitations, peripheral edema, PND, shortness of breath or sweats. There are no associated agents to hypertension. There are no known risk factors for coronary artery disease. Past treatments include angiotensin blockers. There is no history of angina, kidney  disease, CAD/MI, CVA, heart failure, left ventricular hypertrophy, PVD or retinopathy. There is no history of chronic renal disease, a hypertension causing med or renovascular disease.  Hyperlipidemia This is a chronic problem. The current episode started more than 1 year ago. The problem is controlled. Recent lipid tests were reviewed and are normal. He has no history of chronic renal disease, diabetes, hypothyroidism, liver disease, obesity or nephrotic syndrome. Pertinent negatives include no chest pain, myalgias or shortness of breath. He is currently on no antihyperlipidemic treatment. The current treatment provides moderate improvement of lipids. There are no compliance problems.  There are no known risk factors for coronary artery disease.    Lab Results  Component Value Date   NA 141 06/06/2021   K 4.5 06/06/2021   CO2 25 06/06/2021   GLUCOSE 113 (H) 06/06/2021   BUN 16 06/06/2021   CREATININE 0.81 06/06/2021   CALCIUM 9.5 06/06/2021   EGFR 109 06/06/2021   GFRNONAA >60 10/25/2019   Lab Results  Component Value Date   CHOL 123 06/06/2021   HDL 42 06/06/2021   LDLCALC 69 06/06/2021   TRIG 56 06/06/2021   No results found for: "TSH" Lab Results  Component Value Date   HGBA1C 6.5 (H) 06/06/2021   No results found for: "WBC", "HGB", "HCT", "MCV", "PLT" Lab Results  Component Value Date   ALT 20 06/06/2021   AST 19 06/06/2021   ALKPHOS 64 06/06/2021   BILITOT 0.7 06/06/2021   No results found for: "25OHVITD2", "25OHVITD3", "VD25OH"   Review of Systems  Constitutional:  Negative for chills, fever and malaise/fatigue.  HENT:  Negative for drooling, ear discharge, ear pain and sore throat.   Eyes:  Negative for blurred vision.  Respiratory:  Negative for cough, shortness of breath and wheezing.   Cardiovascular:  Negative for chest pain, palpitations, orthopnea, leg  swelling and PND.  Gastrointestinal:  Negative for abdominal pain, blood in stool, constipation, diarrhea  and nausea.  Endocrine: Negative for polydipsia.  Genitourinary:  Negative for dysuria, frequency, hematuria, nocturia and urgency.  Musculoskeletal:  Negative for back pain, myalgias and neck pain.  Skin:  Negative for rash.  Allergic/Immunologic: Negative for environmental allergies.  Neurological:  Negative for dizziness and headaches.  Hematological:  Does not bruise/bleed easily.  Psychiatric/Behavioral:  Negative for suicidal ideas. The patient is not nervous/anxious.     Patient Active Problem List   Diagnosis Date Noted   Colon cancer screening    Adult BMI 40.0-44.9 kg/sq m ([facility]) 11/22/2020   Plaque psoriasis 10/03/2019    Allergies  Allergen Reactions   Percocet [Oxycodone-Acetaminophen] Itching    Past Surgical History:  Procedure Laterality Date   COLONOSCOPY WITH PROPOFOL N/A 06/21/2021   Procedure: COLONOSCOPY WITH PROPOFOL;  Surgeon: [name], MD;  Location: [facility];  Service: Endoscopy;  Laterality: N/A;   FRACTURE SURGERY     right lower leg    Social History   Tobacco Use   Smoking status: Former    Packs/day: 0.25    Types: Cigarettes    Quit date: 06/19/2020    Years since quitting: 1.5   Smokeless tobacco: Never  Vaping Use   Vaping Use: Never used  Substance Use Topics   Alcohol use: Not Currently   Drug use: Never     Medication list has been reviewed and updated.  Current Meds  Medication Sig   atorvastatin (LIPITOR) 10 MG tablet Take 1 tablet (10 mg total) by mouth daily.   Blood Glucose Monitoring Suppl (FREESTYLE FREEDOM) KIT 1 Units by Does not apply route in the morning and at bedtime.   clobetasol ointment (TEMOVATE) 0.05 % Apply topically.   glucose blood test strip Check your sugar in the morning before you eat breakfast, and one hour after a meal.   ibuprofen (ADVIL) 600 MG tablet Take 1 tablet (600 mg total) by mouth every 6 (six) hours as needed.   losartan (COZAAR) 25 MG tablet Take 1 tablet (25 mg total) by  mouth daily.   metFORMIN (GLUCOPHAGE-XR) 750 MG 24 hr tablet Take 750 mg by mouth daily with breakfast.   OZEMPIC, 1 MG/DOSE, 4 MG/3ML SOPN Inject into the skin. [name]   sildenafil (VIAGRA) 100 MG tablet Take 1 tablet (100 mg total) by mouth as needed for erectile dysfunction.   [DISCONTINUED] metoCLOPramide (REGLAN) 10 MG tablet Take 1 tablet (10 mg total) by mouth every 6 (six) hours.       01/03/2022    9:59 AM 05/27/2021    8:28 AM 11/22/2020    3:05 PM 01/17/2020    2:07 PM  GAD 7 : Generalized Anxiety Score  Nervous, Anxious, on Edge 0 0 0 0  Control/stop worrying 0 0 0 0  Worry too much - different things 0 0 0 0  Trouble relaxing 0 0 0 0  Restless 0 0 0 0  Easily annoyed or irritable 0 0 0 0  Afraid - awful might happen 0 0 0 0  Total GAD 7 Score 0 0 0 0  Anxiety Difficulty Not difficult at all Not difficult at all Not difficult at all        01/03/2022    9:58 AM 05/27/2021    8:27 AM 11/22/2020    3:05 PM  Depression screen PHQ 2/9  Decreased Interest 0 0 0  Down, Depressed, Hopeless 0  0 0  PHQ - 2 Score 0 0 0  Altered sleeping 0 0 0  Tired, decreased energy 0 0 0  Change in appetite 0 0 0  Feeling bad or failure about yourself  0 0 0  Trouble concentrating 0 0 0  Moving slowly or fidgety/restless 0 0 0  Suicidal thoughts 0 0 0  PHQ-9 Score 0 0 0  Difficult doing work/chores Not difficult at all Not difficult at all     BP Readings from Last 3 Encounters:  01/03/22 120/70  06/21/21 106/68  05/27/21 130/80    Physical Exam Vitals and nursing note reviewed.  HENT:     Head: Normocephalic.     Right Ear: Tympanic membrane, ear canal and external ear normal.     Left Ear: Tympanic membrane, ear canal and external ear normal.     Nose: Nose normal.     Mouth/Throat:     Mouth: Mucous membranes are moist.     Pharynx: No oropharyngeal exudate or posterior oropharyngeal erythema.  Eyes:     General: No scleral icterus.       Right eye: No discharge.         Left eye: No discharge.     Conjunctiva/sclera: Conjunctivae normal.     Pupils: Pupils are equal, round, and reactive to light.  Neck:     Thyroid: No thyromegaly.     Vascular: No JVD.     Trachea: No tracheal deviation.  Cardiovascular:     Rate and Rhythm: Normal rate and regular rhythm.     Heart sounds: Normal heart sounds. No murmur heard.    No friction rub. No gallop.  Pulmonary:     Effort: No respiratory distress.     Breath sounds: Normal breath sounds. No wheezing, rhonchi or rales.  Abdominal:     General: Bowel sounds are normal.     Palpations: Abdomen is soft. There is no mass.     Tenderness: There is no abdominal tenderness. There is no guarding or rebound.  Musculoskeletal:        General: No tenderness. Normal range of motion.     Cervical back: Normal range of motion and neck supple.  Lymphadenopathy:     Cervical: No cervical adenopathy.  Skin:    General: Skin is warm.     Findings: No rash.  Neurological:     Mental Status: He is alert and oriented to person, place, and time.     Cranial Nerves: No cranial nerve deficit.     Gait: Gait normal.     Deep Tendon Reflexes: Reflexes are normal and symmetric. Reflexes normal.     Wt Readings from Last 3 Encounters:  01/03/22 288 lb (130.6 kg)  06/21/21 272 lb (123.4 kg)  05/27/21 286 lb (129.7 kg)    BP 120/70   Pulse 64   Ht '5\' 7"'  (1.702 m)   Wt 288 lb (130.6 kg)   BMI 45.11 kg/m   Assessment and Plan:  1. Familial hypercholesterolemia Chronic.  Controlled.  Atorvastatin 10 mg - atorvastatin (LIPITOR) 10 MG tablet; Take 1 tablet (10 mg total) by mouth daily.  Dispense: 90 tablet; Refill: 0 - Lipid Panel With LDL/HDL Ratio  2. Primary hypertension Controlled.  Stable. Losartan 25 mg once a day. - losartan (COZAAR) 25 MG tablet; Take 1 tablet (25 mg total) by mouth daily.  Dispense: 90 tablet; Refill: 1 - Comprehensive Metabolic Panel (CMET)  3. Erectile dysfunction due to diseases  classified elsewhere Chronic.  Controlled.  Stable.  Continue sildenafil as needed. - sildenafil (VIAGRA) 100 MG tablet; Take 1 tablet (100 mg total) by mouth as needed for erectile dysfunction.  Dispense: 10 tablet; Refill: 0  4. Type 2 diabetes mellitus without complication, without long-term current use of insulin (HCC) Chronic.  Uncontrolled.  Stable.  Followed by endocrinology at [facility] patient has been out of medications due to unable to ask.  Patient has been told how important these are and understands and patient is to return in October 17 with Dr. Purnell Shoemaker.  In the meantime we will obtain a CMP hemoglobin A1c and microalbuminuria. - HgB A1c - Comprehensive Metabolic Panel (CMET) - Microalbumin, urine    Otilio Miu, MD

## 2022-01-04 LAB — COMPREHENSIVE METABOLIC PANEL
ALT: 21 IU/L (ref 0–44)
AST: 18 IU/L (ref 0–40)
Albumin/Globulin Ratio: 1.3 (ref 1.2–2.2)
Albumin: 4.1 g/dL (ref 4.1–5.1)
Alkaline Phosphatase: 74 IU/L (ref 44–121)
BUN/Creatinine Ratio: 15 (ref 9–20)
BUN: 12 mg/dL (ref 6–24)
Bilirubin Total: 0.6 mg/dL (ref 0.0–1.2)
CO2: 25 mmol/L (ref 20–29)
Calcium: 9.5 mg/dL (ref 8.7–10.2)
Chloride: 100 mmol/L (ref 96–106)
Creatinine, Ser: 0.81 mg/dL (ref 0.76–1.27)
Globulin, Total: 3.1 g/dL (ref 1.5–4.5)
Glucose: 142 mg/dL — ABNORMAL HIGH (ref 70–99)
Potassium: 4.5 mmol/L (ref 3.5–5.2)
Sodium: 137 mmol/L (ref 134–144)
Total Protein: 7.2 g/dL (ref 6.0–8.5)
eGFR: 108 mL/min/{1.73_m2} (ref >59)

## 2022-01-04 LAB — LIPID PANEL WITH LDL/HDL RATIO
Cholesterol, Total: 154 mg/dL (ref 100–199)
HDL: 35 mg/dL — ABNORMAL LOW (ref >39)
LDL Chol Calc (NIH): 97 mg/dL (ref 0–99)
LDL/HDL Ratio: 2.8 ratio (ref 0.0–3.6)
Triglycerides: 123 mg/dL (ref 0–149)
VLDL Cholesterol Cal: 22 mg/dL (ref 5–40)

## 2022-01-04 LAB — HEMOGLOBIN A1C
Est. average glucose Bld gHb Est-mCnc: 171 mg/dL
Hgb A1c MFr Bld: 7.6 % — ABNORMAL HIGH (ref 4.8–5.6)

## 2022-01-04 LAB — MICROALBUMIN, URINE: Microalbumin, Urine: 5 ug/mL

## 2022-01-10 ENCOUNTER — Ambulatory Visit: Payer: Self-pay

## 2022-01-10 NOTE — Telephone Encounter (Signed)
     Chief Complaint: Vomiting after taking Ozempic, has nausea. Took it last Friday and had vomiting. Took it today Symptoms: Above Frequency: Last week Pertinent Negatives: Patient denies diarrhea Disposition: [] ED /[] Urgent Care (no appt availability in office) / [] Appointment(In office/virtual)/ []  [facility]/ [] Home Care/ [] Refused Recommended Disposition /[] [facility]/ []  Follow-up with PCP Additional Notes: Pt. Had stopped Ozempic and re-started it last Friday. Having vomiting. Please advise pt.  Answer Assessment - Initial Assessment Questions 1. VOMITING SEVERITY: "How many times have you vomited in the past 24 hours?"     - MILD:  1 - 2 times/day    - MODERATE: 3 - 5 times/day, decreased oral intake without significant weight loss or symptoms of dehydration    - SEVERE: 6 or more times/day, vomits everything or nearly everything, with significant weight loss, symptoms of dehydration      1 after taking Ozempic 2. ONSET: "When did the vomiting begin?"      Saturday 3. FLUIDS: "What fluids or food have you vomited up today?" "Have you been able to keep any fluids down?"     Yes 4. ABDOMEN PAIN: "Are your having any abdomen pain?" If Yes : "How bad is it and what does it feel like?" (e.g., crampy, dull, intermittent, constant)      Gas 5. DIARRHEA: "Is there any diarrhea?" If Yes, ask: "How many times today?"      No 6. CONTACTS: "Is there anyone else in the family with the same symptoms?"      No 7. CAUSE: "What do you think is causing your vomiting?"     Ozempic  8. HYDRATION STATUS: "Any signs of dehydration?" (e.g., dry mouth [not only dry lips], too weak to stand) "When did you last urinate?"     No 9. OTHER SYMPTOMS: "Do you have any other symptoms?" (e.g., fever, headache, vertigo, vomiting blood or coffee grounds, recent head injury)     No 10. PREGNANCY: "Is there any chance you are pregnant?" "When was your last menstrual period?"        N/a  Protocols used: Vomiting-A-AH

## 2022-01-13 ENCOUNTER — Telehealth: Payer: Self-pay

## 2022-01-13 NOTE — Telephone Encounter (Signed)
Cut back to 0.25 x 2 weeks and will call on Sept 10th with how he is doing and we will go up to 0.5 until he sees endo on 03/04/22

## 2022-02-03 ENCOUNTER — Ambulatory Visit: Payer: Self-pay

## 2022-02-03 ENCOUNTER — Telehealth: Payer: Self-pay

## 2022-02-03 NOTE — Telephone Encounter (Signed)
Called pt back with a cancellation appt with Dr. Gabriel Carina at [facility] in [facility] 02/04/22 @ 2:45

## 2022-02-03 NOTE — Telephone Encounter (Signed)
Pt is calling to report that last night he vomited and today he feels nausous. Pt feels like it may be his  OZEMPIC, 1 MG/DOSE, 4 MG/3ML SOPN [[id number]] . Pts dosage has been adjusted recently. Please advise     Chief Complaint: Pt. Reports Ozempic "still causing vomiting on and off." Decreased dosage as instructed. Symptoms: Above Frequency: 2 weeks Pertinent Negatives: Patient denies  Disposition: [] ED /[] Urgent Care (no appt availability in office) / [] Appointment(In office/virtual)/ []  [facility]/ [] Home Care/ [] Refused Recommended Disposition /[] [facility]/ [x]  Follow-up with PCP Additional Notes: Please advise pt.  Answer Assessment - Initial Assessment Questions 1. NAME of MEDICINE: "What medicine(s) are you calling about?"     Ozempic 2. QUESTION: "What is your question?" (e.g., double dose of medicine, side effect)     Side effects 3. PRESCRIBER: "Who prescribed the medicine?" Reason: if prescribed by specialist, call should be referred to that group.     Dr. Ronnald Ramp 4. SYMPTOMS: "Do you have any symptoms?" If Yes, ask: "What symptoms are you having?"  "How bad are the symptoms (e.g., mild, moderate, severe)     Vomiting, nausea from medication  5. PREGNANCY:  "Is there any chance that you are pregnant?" "When was your last menstrual period?"     N/a  Protocols used: Medication Question Call-A-AH

## 2022-02-04 ENCOUNTER — Telehealth: Payer: Self-pay

## 2022-02-04 DIAGNOSIS — F17200 Nicotine dependence, unspecified, uncomplicated: Secondary | ICD-10-CM | POA: Diagnosis not present

## 2022-02-04 DIAGNOSIS — E1165 Type 2 diabetes mellitus with hyperglycemia: Secondary | ICD-10-CM | POA: Diagnosis not present

## 2022-02-04 NOTE — Telephone Encounter (Signed)
Called pt and left message to remind him about the appt today at [facility] at 245, be there at 230. Also, left message on wife's phone

## 2022-02-27 ENCOUNTER — Other Ambulatory Visit: Payer: Self-pay | Admitting: Family Medicine

## 2022-02-27 DIAGNOSIS — N521 Erectile dysfunction due to diseases classified elsewhere: Secondary | ICD-10-CM

## 2022-02-28 NOTE — Telephone Encounter (Signed)
Requested Prescriptions  Pending Prescriptions Disp Refills  . sildenafil (VIAGRA) 100 MG tablet [Pharmacy Med Name: Sildenafil Citrate 100 MG Oral Tablet] 10 tablet 2    Sig: TAKE 1 TABLET BY MOUTH AS NEEDED FOR ERECTILE DYSFUNCTION     Urology: Erectile Dysfunction Agents Passed - 02/27/2022  9:54 PM      Passed - AST in normal range and within 360 days    AST  Date Value Ref Range Status  01/03/2022 18 0 - 40 IU/L Final         Passed - ALT in normal range and within 360 days    ALT  Date Value Ref Range Status  01/03/2022 21 0 - 44 IU/L Final         Passed - Last BP in normal range    BP Readings from Last 1 Encounters:  01/03/22 120/70         Passed - Valid encounter within last 12 months    Recent Outpatient Visits          1 month ago Primary hypertension   [facility] [place], Deanna C, MD   9 months ago Primary hypertension   [facility] [place], Deanna C, MD   1 year ago Primary hypertension   [facility] [place], Deanna C, MD   1 year ago Primary hypertension   [facility] [place], Deanna C, MD   1 year ago Type 2 diabetes mellitus without complication, without long-term current use of insulin ([place])   [facility] [place], [place], MD      Future Appointments            In 3 months Juline Patch, MD [place] [facility] at [facility], [facility]

## 2022-03-31 ENCOUNTER — Encounter: Payer: Self-pay | Admitting: Family Medicine

## 2022-03-31 ENCOUNTER — Ambulatory Visit: Payer: Self-pay | Admitting: *Deleted

## 2022-03-31 ENCOUNTER — Ambulatory Visit: Payer: BC Managed Care – PPO | Admitting: Family Medicine

## 2022-03-31 VITALS — BP 128/76 | HR 81 | Ht 67.0 in | Wt 284.0 lb

## 2022-03-31 DIAGNOSIS — R55 Syncope and collapse: Secondary | ICD-10-CM | POA: Diagnosis not present

## 2022-03-31 DIAGNOSIS — I10 Essential (primary) hypertension: Secondary | ICD-10-CM

## 2022-03-31 DIAGNOSIS — N521 Erectile dysfunction due to diseases classified elsewhere: Secondary | ICD-10-CM

## 2022-03-31 DIAGNOSIS — E119 Type 2 diabetes mellitus without complications: Secondary | ICD-10-CM | POA: Diagnosis not present

## 2022-03-31 NOTE — Progress Notes (Signed)
Date:  03/31/2022   Name:  Brandon Green   DOB:  10-Aug-1970   MRN:  [id number]   Chief Complaint: Loss of Consciousness (Scratched back and tore skin off pinky toe. Does not think he hit head when he fell. Happened about 130 in the am Sunday morning. Did not have any sign he was going to pass out. BS was 185 when he checked it)  Loss of Consciousness This is a new problem. The current episode started in the past 7 days. Episode frequency: one episode. The problem has been gradually improving. He lost consciousness for a period of 1 to 5 minutes. The symptoms are aggravated by urination. Associated symptoms include diaphoresis. Pertinent negatives include no back pain, bladder incontinence, bowel incontinence, chest pain, confusion, dizziness, focal sensory loss, focal weakness, headaches, light-headedness, nausea, palpitations, slurred speech, vertigo, visual change, vomiting or weakness. The treatment provided mild relief. There is no history of seizures.    Lab Results  Component Value Date   NA 137 01/03/2022   K 4.5 01/03/2022   CO2 25 01/03/2022   GLUCOSE 142 (H) 01/03/2022   BUN 12 01/03/2022   CREATININE 0.81 01/03/2022   CALCIUM 9.5 01/03/2022   EGFR 108 01/03/2022   GFRNONAA >60 10/25/2019   Lab Results  Component Value Date   CHOL 154 01/03/2022   HDL 35 (L) 01/03/2022   LDLCALC 97 01/03/2022   TRIG 123 01/03/2022   No results found for: "TSH" Lab Results  Component Value Date   HGBA1C 7.6 (H) 01/03/2022   No results found for: "WBC", "HGB", "HCT", "MCV", "PLT" Lab Results  Component Value Date   ALT 21 01/03/2022   AST 18 01/03/2022   ALKPHOS 74 01/03/2022   BILITOT 0.6 01/03/2022   No results found for: "25OHVITD2", "25OHVITD3", "VD25OH"   Review of Systems  Constitutional:  Positive for diaphoresis.  Eyes:  Negative for visual disturbance.  Respiratory:  Negative for chest tightness and shortness of breath.   Cardiovascular:  Positive for syncope.  Negative for chest pain, palpitations and leg swelling.  Gastrointestinal:  Negative for bowel incontinence, constipation, nausea and vomiting.  Endocrine: Negative for polydipsia and polyuria.  Genitourinary:  Negative for bladder incontinence and difficulty urinating.  Musculoskeletal:  Negative for back pain.  Neurological:  Negative for dizziness, vertigo, focal weakness, weakness, light-headedness and headaches.  Psychiatric/Behavioral:  Negative for confusion.     Patient Active Problem List   Diagnosis Date Noted   Colon cancer screening    Adult BMI 40.0-44.9 kg/sq m ([place]) 11/22/2020   Plaque psoriasis 10/03/2019    Allergies  Allergen Reactions   Percocet [Oxycodone-Acetaminophen] Itching    Past Surgical History:  Procedure Laterality Date   COLONOSCOPY WITH PROPOFOL N/A 06/21/2021   Procedure: COLONOSCOPY WITH PROPOFOL;  Surgeon: [name], MD;  Location: [facility];  Service: Endoscopy;  Laterality: N/A;   FRACTURE SURGERY     right lower leg    Social History   Tobacco Use   Smoking status: Former    Packs/day: 0.25    Types: Cigarettes    Quit date: 06/19/2020    Years since quitting: 1.7   Smokeless tobacco: Never  Vaping Use   Vaping Use: Never used  Substance Use Topics   Alcohol use: Not Currently   Drug use: Never     Medication list has been reviewed and updated.  Current Meds  Medication Sig   atorvastatin (LIPITOR) 10 MG tablet Take 1 tablet (10 mg  total) by mouth daily.   Blood Glucose Monitoring Suppl (FREESTYLE FREEDOM) KIT 1 Units by Does not apply route in the morning and at bedtime.   clobetasol ointment (TEMOVATE) 0.05 % Apply topically.   glucose blood test strip Check your sugar in the morning before you eat breakfast, and one hour after a meal.   ibuprofen (ADVIL) 600 MG tablet Take 1 tablet (600 mg total) by mouth every 6 (six) hours as needed.   losartan (COZAAR) 25 MG tablet Take 1 tablet (25 mg total) by mouth daily.    metFORMIN (GLUCOPHAGE-XR) 750 MG 24 hr tablet Take 750 mg by mouth daily with breakfast.   Semaglutide 3 MG TABS Take 1 tablet by mouth daily.   sildenafil (VIAGRA) 100 MG tablet TAKE 1 TABLET BY MOUTH AS NEEDED FOR ERECTILE DYSFUNCTION   [DISCONTINUED] OZEMPIC, 1 MG/DOSE, 4 MG/3ML SOPN Inject into the skin. [name]       03/31/2022    1:38 PM 01/03/2022    9:59 AM 05/27/2021    8:28 AM 11/22/2020    3:05 PM  GAD 7 : Generalized Anxiety Score  Nervous, Anxious, on Edge 0 0 0 0  Control/stop worrying 0 0 0 0  Worry too much - different things 0 0 0 0  Trouble relaxing 0 0 0 0  Restless 0 0 0 0  Easily annoyed or irritable 0 0 0 0  Afraid - awful might happen 0 0 0 0  Total GAD 7 Score 0 0 0 0  Anxiety Difficulty Not difficult at all Not difficult at all Not difficult at all Not difficult at all       03/31/2022    1:37 PM 01/03/2022    9:58 AM 05/27/2021    8:27 AM  Depression screen PHQ 2/9  Decreased Interest 0 0 0  Down, Depressed, Hopeless 0 0 0  PHQ - 2 Score 0 0 0  Altered sleeping 0 0 0  Tired, decreased energy 0 0 0  Change in appetite 0 0 0  Feeling bad or failure about yourself  0 0 0  Trouble concentrating 0 0 0  Moving slowly or fidgety/restless 0 0 0  Suicidal thoughts 0 0 0  PHQ-9 Score 0 0 0  Difficult doing work/chores Not difficult at all Not difficult at all Not difficult at all    BP Readings from Last 3 Encounters:  03/31/22 128/76  01/03/22 120/70  06/21/21 106/68    Physical Exam Vitals and nursing note reviewed.  HENT:     Head: Normocephalic.     Right Ear: External ear normal.     Left Ear: External ear normal.     Nose: Nose normal.     Mouth/Throat:     Mouth: Mucous membranes are moist.  Eyes:     General: No scleral icterus.       Right eye: No discharge.        Left eye: No discharge.     Conjunctiva/sclera: Conjunctivae normal.     Pupils: Pupils are equal, round, and reactive to light.  Neck:     Thyroid: No thyromegaly.      Vascular: No JVD.     Trachea: No tracheal deviation.  Cardiovascular:     Rate and Rhythm: Normal rate and regular rhythm.     Heart sounds: Normal heart sounds. No murmur heard.    No friction rub. No gallop.  Pulmonary:     Effort: No respiratory distress.  Breath sounds: Normal breath sounds. No wheezing, rhonchi or rales.  Abdominal:     General: Bowel sounds are normal.     Palpations: Abdomen is soft. There is no mass.     Tenderness: There is no abdominal tenderness. There is no guarding or rebound.  Musculoskeletal:        General: No tenderness. Normal range of motion.     Cervical back: Normal range of motion and neck supple.     Right lower leg: No edema.     Left lower leg: No edema.  Lymphadenopathy:     Cervical: No cervical adenopathy.  Skin:    General: Skin is warm.     Capillary Refill: Capillary refill takes less than 2 seconds.     Findings: No rash.  Neurological:     Mental Status: He is alert and oriented to person, place, and time.     Cranial Nerves: Cranial nerves 2-12 are intact. No cranial nerve deficit.     Sensory: Sensation is intact.     Motor: Motor function is intact.     Deep Tendon Reflexes: Reflexes are normal and symmetric. Reflexes normal.     Wt Readings from Last 3 Encounters:  03/31/22 284 lb (128.8 kg)  01/03/22 288 lb (130.6 kg)  06/21/21 272 lb (123.4 kg)    BP 128/76   Pulse 81   Ht _0  (1.702 m)   Wt 284 lb (128.8 kg)   SpO2 97%   BMI 44.48 kg/m   Assessment and Plan:  1. Syncope and collapse Partially witnessed/patient's wife heard him collapse and found him shortly afterwards that he likely woke up with in 2 to 3 minutes of passing out.  Syncopal episode with dizziness and diaphoresis.  EKG was done with following results: Rate 74 intervals normal rhythm regular.  No LVH criteria met by voltage.  No ischemic signals such as Q waves, delay in R wave progression, and ST-T wave changes.  There is low voltage likely  reflecting patient's obesity.  We will make referral to cardiology for possible stress test and/or Holter monitor.  Patient's been instructed to go to the ER if another episode should occur and that he should be seated with micturition. - EKG 12-Lead - Ambulatory referral to Cardiology  2. Type 2 diabetes mellitus without complication, without long-term current use of insulin [facility]) Patient is followed by endocrinology.  This may have been contributed by diuresis from several beers that he had during the day and diabetes polyuria.  Blood sugar was done and was 185 at the time of the episode so it is doubtful due to hypo glycemia.  3. Primary hypertension Continues on low-dose losartan primarily for diabetic nephropathy coverage.  We will continue to monitor blood pressure but at this point in time we will continue at this current dosing and that blood pressure is appropriate at 128/76.  4. Erectile dysfunction due to diseases classified elsewhere Patient several hours prior had intercourse in which he used his sildenafil.  I have instructed that he can break it in half and only take 50 mg in the future.   Otilio Miu, MD

## 2022-03-31 NOTE — Telephone Encounter (Signed)
  Chief Complaint: fainted x 1  Symptoms: went to bathroom late Saturday night early Sunday morning to use bathroom and fainted. Wife found patient on floor. Patient not sure how long he was there. Scratch to back and "little toe". Denies any sx now . Reports fainting as a child but has not fainted in approx. 40 years Frequency: x 1 day ago Pertinent Negatives: Patient denies chest pain no difficulty breathing, no fever. No heart palpitations. No dizziness  Disposition: [] ED /[] Urgent Care (no appt availability in office) / [x] Appointment(In office/virtual)/ []  [facility]/ [] Home Care/ [] Refused Recommended Disposition /[] [facility]/ []  Follow-up with PCP Additional Notes:   Appt today .      Reason for Disposition  [1] All other patients AND [2] now alert and feels fine  (Exception: SIMPLE FAINT due to stress, pain, prolonged standing, or suddenly standing)  Answer Assessment - Initial Assessment Questions 1. ONSET: "How long were you unconscious?" (minutes) "When did it happen?"     Not sure  2. CONTENT: "What happened during period of unconsciousness?" (e.g., seizure activity)      Not sure standing at night  3. MENTAL STATUS: "Alert and oriented now?" (oriented x 3 = name, month, location)      *No Answer* 4. TRIGGER: "What do you think caused the fainting?" "What were you doing just before you fainted?"  (e.g., exercise, sudden standing up, prolonged standing)     Not sure  5. RECURRENT SYMPTOM: "Have you ever passed out before?" If Yes, ask: "When was the last time?" and "What happened that time?"      When real young but has been atleast 40 years 6. INJURY: "Did you sustain any injury during the fall?"      Scratch on back and little toe scratch skin off. 7. CARDIAC SYMPTOMS: "Have you had any of the following symptoms: chest pain, difficulty breathing, palpitations?"     No  8. NEUROLOGIC SYMPTOMS: "Have you had any of the following symptoms: headache,  numbness, vertigo, weakness?"     No  9. GI SYMPTOMS: "Have you had any of the following symptoms: abdomen pain, vomiting, diarrhea, blood in stools?"     No  10. OTHER SYMPTOMS: "Do you have any other symptoms?"       None  11. PREGNANCY: "Is there any chance you are pregnant?" "When was your last menstrual period?"       na  Protocols used: Fainting-A-AH

## 2022-03-31 NOTE — Patient Instructions (Signed)
Syncope, Adult  Syncope refers to a condition in which a person temporarily loses consciousness. Syncope may also be called fainting or passing out. It is caused by a sudden decrease in blood flow to the brain. This can happen for a variety of reasons. Most causes of syncope are not dangerous. It can be triggered by things such as needle sticks, seeing blood, pain, or intense emotion. However, syncope can also be a sign of a serious medical problem, such as a heart abnormality. Other causes can include dehydration, migraines, or taking medicines that lower blood pressure. Your health care provider may do tests to find the reason why you are having syncope. If you faint, get medical help right away. Call your local emergency services (911 in the [place] Follow these instructions at home: Pay attention to any changes in your symptoms. Take these actions to stay safe and to help relieve your symptoms: Knowing when you may be about to faint Signs that you may be about to faint include: Feeling dizzy, weak, light-headed, or like the room is spinning. Feeling nauseous. Seeing spots or seeing all white or all black in your field of vision. Having cold, clammy skin or feeling warm and sweaty. Hearing ringing in the ears (tinnitus). If you start to feel like you might faint, sit or lie down right away. If sitting, put your head down between your legs. If lying down, raise (elevate) your feet above the level of your heart. Breathe deeply and steadily. Wait until all the symptoms have passed. Have someone stay with you until you feel stable. Medicines Take over-the-counter and prescription medicines only as told by your health care provider. If you are taking blood pressure or heart medicine, get up slowly and take several minutes to sit and then stand. This can reduce dizziness and decrease the risk of syncope. Lifestyle Do not drive, use machinery, or play sports until your health care provider says it is  okay. Do not drink alcohol. Do not use any products that contain nicotine or tobacco. These products include cigarettes, chewing tobacco, and vaping devices, such as e-cigarettes. If you need help quitting, ask your health care provider. Avoid hot tubs and saunas. General instructions Talk with your health care provider about your symptoms. You may need to have testing to understand the cause of your syncope. Drink enough fluid to keep your urine pale yellow. Avoid prolonged standing. If you must stand for a long time, do movements such as: Moving your legs. Crossing your legs. Flexing and stretching your leg muscles. Squatting. Keep all follow-up visits. This is important. Contact a health care provider if: You have episodes of near fainting. Get help right away if: You faint. You hit your head or are injured after fainting. You have any of these symptoms that may indicate trouble with your heart: Fast or irregular heartbeats (palpitations). Unusual pain in your chest, abdomen, or back. Shortness of breath. You have a seizure. You have a severe headache. You are confused. You have vision problems. You have severe weakness or trouble walking. You are bleeding from your mouth or rectum, or you have black or tarry stool. These symptoms may represent a serious problem that is an emergency. Do not wait to see if your symptoms will go away. Get medical help right away. Call your local emergency services (911 in the [place] Do not drive yourself to the hospital. Summary Syncope refers to a condition in which a person temporarily loses consciousness. Syncope may also be called   fainting or passing out. It is caused by a sudden decrease in blood flow to the brain. Signs that you may be about to faint include dizziness, feeling light-headed, feeling nauseous, sudden vision changes, or cold, clammy skin. Even though most causes of syncope are not dangerous, syncope can be a sign of a serious  medical problem. Get help right away if you faint. If you start to feel like you might faint, sit or lie down right away. If sitting, put your head down between your legs. If lying down, raise (elevate) your feet above the level of your heart. This information is not intended to replace advice given to you by your health care provider. Make sure you discuss any questions you have with your health care provider. Document Revised: 09/13/2020 Document Reviewed: 09/13/2020 Elsevier Patient Education  2023 [employer] Low-Sodium Eating Plan Sodium, which is an element that makes up salt, helps you maintain a healthy balance of fluids in your body. Too much sodium can increase your blood pressure and cause fluid and waste to be held in your body. Your health care provider or dietitian may recommend following this plan if you have high blood pressure (hypertension), kidney disease, liver disease, or heart failure. Eating less sodium can help lower your blood pressure, reduce swelling, and protect your heart, liver, and kidneys. What are tips for following this plan? Reading food labels The Nutrition Facts label lists the amount of sodium in one serving of the food. If you eat more than one serving, you must multiply the listed amount of sodium by the number of servings. Choose foods with less than 140 mg of sodium per serving. Avoid foods with 300 mg of sodium or more per serving. Shopping  Look for lower-sodium products, often labeled as "low-sodium" or "no salt added." Always check the sodium content, even if foods are labeled as "unsalted" or "no salt added." Buy fresh foods. Avoid canned foods and pre-made or frozen meals. Avoid canned, cured, or processed meats. Buy breads that have less than 80 mg of sodium per slice. Cooking  Eat more home-cooked food and less restaurant, buffet, and fast food. Avoid adding salt when cooking. Use salt-free seasonings or herbs instead of table salt or sea  salt. Check with your health care provider or pharmacist before using salt substitutes. Cook with plant-based oils, such as canola, sunflower, or olive oil. Meal planning When eating at a restaurant, ask that your food be prepared with less salt or no salt, if possible. Avoid dishes labeled as brined, pickled, cured, smoked, or made with soy sauce, miso, or teriyaki sauce. Avoid foods that contain MSG (monosodium glutamate). MSG is sometimes added to [place] food, bouillon, and some canned foods. Make meals that can be grilled, baked, poached, roasted, or steamed. These are generally made with less sodium. General information Most people on this plan should limit their sodium intake to 1,500-2,000 mg (milligrams) of sodium each day. What foods should I eat? Fruits Fresh, frozen, or canned fruit. Fruit juice. Vegetables Fresh or frozen vegetables. "No salt added" canned vegetables. "No salt added" tomato sauce and paste. Low-sodium or reduced-sodium tomato and vegetable juice. Grains Low-sodium cereals, including oats, puffed wheat and rice, and shredded wheat. Low-sodium crackers. Unsalted rice. Unsalted pasta. Low-sodium bread. Whole-grain breads and whole-grain pasta. Meats and other proteins Fresh or frozen (no salt added) meat, poultry, seafood, and fish. Low-sodium canned tuna and salmon. Unsalted nuts. Dried peas, beans, and lentils without added salt. Unsalted canned beans. Eggs.  Unsalted nut butters. Dairy Milk. Soy milk. Cheese that is naturally low in sodium, such as ricotta cheese, fresh mozzarella, or Swiss cheese. Low-sodium or reduced-sodium cheese. Cream cheese. Yogurt. Seasonings and condiments Fresh and dried herbs and spices. Salt-free seasonings. Low-sodium mustard and ketchup. Sodium-free salad dressing. Sodium-free light mayonnaise. Fresh or refrigerated horseradish. Lemon juice. Vinegar. Other foods Homemade, reduced-sodium, or low-sodium soups. Unsalted popcorn and  pretzels. Low-salt or salt-free chips. The items listed above may not be a complete list of foods and beverages you can eat. Contact a dietitian for more information. What foods should I avoid? Vegetables Sauerkraut, pickled vegetables, and relishes. Olives. [place] fries. Onion rings. Regular canned vegetables (not low-sodium or reduced-sodium). Regular canned tomato sauce and paste (not low-sodium or reduced-sodium). Regular tomato and vegetable juice (not low-sodium or reduced-sodium). Frozen vegetables in sauces. Grains Instant hot cereals. Bread stuffing, pancake, and biscuit mixes. Croutons. Seasoned rice or pasta mixes. Noodle soup cups. Boxed or frozen macaroni and cheese. Regular salted crackers. Self-rising flour. Meats and other proteins Meat or fish that is salted, canned, smoked, spiced, or pickled. Precooked or cured meat, such as sausages or meat loaves. Tomasa Blase. Ham. Pepperoni. Hot dogs. Corned beef. Chipped beef. Salt pork. Jerky. Pickled herring. Anchovies and sardines. Regular canned tuna. Salted nuts. Dairy Processed cheese and cheese spreads. Hard cheeses. Cheese curds. Blue cheese. Feta cheese. String cheese. Regular cottage cheese. Buttermilk. Canned milk. Fats and oils Salted butter. Regular margarine. Ghee. Bacon fat. Seasonings and condiments Onion salt, garlic salt, seasoned salt, table salt, and sea salt. Canned and packaged gravies. Worcestershire sauce. Tartar sauce. Barbecue sauce. Teriyaki sauce. Soy sauce, including reduced-sodium. Steak sauce. Fish sauce. Oyster sauce. Cocktail sauce. Horseradish that you find on the shelf. Regular ketchup and mustard. Meat flavorings and tenderizers. Bouillon cubes. Hot sauce. Pre-made or packaged marinades. Pre-made or packaged taco seasonings. Relishes. Regular salad dressings. Salsa. Other foods Salted popcorn and pretzels. Corn chips and puffs. Potato and tortilla chips. Canned or dried soups. Pizza. Frozen entrees and pot  pies. The items listed above may not be a complete list of foods and beverages you should avoid. Contact a dietitian for more information. Summary Eating less sodium can help lower your blood pressure, reduce swelling, and protect your heart, liver, and kidneys. Most people on this plan should limit their sodium intake to 1,500-2,000 mg (milligrams) of sodium each day. Canned, boxed, and frozen foods are high in sodium. Restaurant foods, fast foods, and pizza are also very high in sodium. You also get sodium by adding salt to food. Try to cook at home, eat more fresh fruits and vegetables, and eat less fast food and canned, processed, or prepared foods. This information is not intended to replace advice given to you by your health care provider. Make sure you discuss any questions you have with your health care provider. Document Revised: 06/10/2019 Document Reviewed: 04/06/2019 Elsevier Patient Education  2023 [employer].

## 2022-04-03 NOTE — Progress Notes (Signed)
Cardiology Office Note   Date:  04/04/2022   ID:  Brandon Green, DOB 11/10/1970, MRN [id number]  PCP:  Brandon Patch, MD  Cardiologist:   None Referring:  Brandon Patch, MD  Chief Complaint  Patient presents with   Loss of Consciousness      History of Present Illness: Brandon Green is a 51 y.o. male who was referred by Brandon Patch, MD for evaluation of syncope.    He has no past cardiac history.  He does have follow-up apnea that is when she was CPAP with that he is not able to wear.  He has had some hypertension.  He does have diabetes.  He is lost about 70 pounds over the last 18 months.  He has an active job as a Engineer, drilling.  He denies any chest pressure, neck or arm discomfort.  He has had any palpitations, presyncope or syncope.  He denies any PND or orthopnea.  Of note he had loss of nodules this earlier this month.  He was sleeping.  He got up to walk 10 feet to go to bathroom.  Finish standing up urinating when his wife heard a thud.  She got to him and he was awake.  His eyes were open but he was confused.  She stood him up and he had some presyncope.  She was finally get him up and walking to the bed.  He refused call 911.  She did check his blood sugar and it was normal.  He has never had syncope before.  He has been feeling well that day.  He is not describing orthostatic symptoms.  He does not have any dizziness or vertiginous symptoms.  He got up and went to work the next morning and was tired that day but otherwise felt fine.  He did take Viagra that evening at about 8 PM.  However, he uses that not infrequently 3-4 times a month without symptoms.   Past Medical History:  Diagnosis Date   Diabetes mellitus without complication ([place])    Hypertension    Psoriasis    Sleep apnea    Cannot wear mask    Past Surgical History:  Procedure Laterality Date   COLONOSCOPY WITH PROPOFOL N/A 06/21/2021   Procedure: COLONOSCOPY WITH PROPOFOL;  Surgeon: [name],  MD;  Location: [facility];  Service: Endoscopy;  Laterality: N/A;   FRACTURE SURGERY     right lower leg     Current Outpatient Medications  Medication Sig Dispense Refill   atorvastatin (LIPITOR) 10 MG tablet Take 1 tablet (10 mg total) by mouth daily. 90 tablet 0   Blood Glucose Monitoring Suppl (FREESTYLE FREEDOM) KIT 1 Units by Does not apply route in the morning and at bedtime. 1 kit 0   clobetasol ointment (TEMOVATE) 0.05 % Apply topically.     glucose blood test strip Check your sugar in the morning before you eat breakfast, and one hour after a meal. 100 each 2   ibuprofen (ADVIL) 600 MG tablet Take 1 tablet (600 mg total) by mouth every 6 (six) hours as needed. 30 tablet 0   losartan (COZAAR) 25 MG tablet Take 1 tablet (25 mg total) by mouth daily. 90 tablet 1   metFORMIN (GLUCOPHAGE-XR) 750 MG 24 hr tablet Take 750 mg by mouth daily with breakfast.     Semaglutide 3 MG TABS Take 1 tablet by mouth daily.     sildenafil (VIAGRA) 100 MG tablet TAKE 1 TABLET BY  MOUTH AS NEEDED FOR ERECTILE DYSFUNCTION 10 tablet 2   No current facility-administered medications for this visit.    Allergies:   Percocet [oxycodone-acetaminophen]    Social History:  The patient  reports that he quit smoking about 21 months ago. His smoking use included cigarettes. He smoked an average of .25 packs per day. He has never used smokeless tobacco. He reports that he does not currently use alcohol. He reports that he does not use drugs.   Family History:  The patient's family history includes Cancer in his maternal aunt and maternal grandfather; Diabetes in his brother and mother; Heart disease in his maternal grandfather and maternal uncle; Hypertension in his maternal grandfather; Stroke in his maternal uncle.    ROS:  Please see the history of present illness.   Otherwise, review of systems are positive for none.   All other systems are reviewed and negative.    PHYSICAL EXAM: VS:  BP 126/78    Pulse 67   Ht _0  (1.702 m)   Wt 274 lb (124.3 kg)   SpO2 96%   BMI 42.91 kg/m  , BMI Body mass index is 42.91 kg/m. GENERAL:  Well appearing HEENT:  Pupils equal round and reactive, fundi not visualized, oral mucosa unremarkable NECK:  No jugular venous distention, waveform within normal limits, carotid upstroke brisk and symmetric, no bruits, no thyromegaly LYMPHATICS:  No cervical, inguinal adenopathy LUNGS:  Clear to auscultation bilaterally BACK:  No CVA tenderness CHEST:  Unremarkable HEART:  PMI not displaced or sustained,S1 and S2 within normal limits, no S3, no S4, no clicks, no rubs, no murmurs ABD:  Flat, positive bowel sounds normal in frequency in pitch, no bruits, no rebound, no guarding, no midline pulsatile mass, no hepatomegaly, no splenomegaly EXT:  2 plus pulses throughout, no edema, no cyanosis no clubbing SKIN:  No rashes no nodules NEURO:  Cranial nerves II through XII grossly intact, motor grossly intact throughout PSYCH:  Cognitively intact, oriented to person place and time    EKG:  EKG is ordered today. The ekg ordered today demonstrates sinus rhythm, 67, low voltage in the limb leads, no acute ST-T wave changes.  Nonspecific T wave flattening   Recent Labs: 01/03/2022: ALT 21; BUN 12; Creatinine, Ser 0.81; Potassium 4.5; Sodium 137    Lipid Panel    Component Value Date/Time   CHOL 154 01/03/2022 1451   TRIG 123 01/03/2022 1451   HDL 35 (L) 01/03/2022 1451   LDLCALC 97 01/03/2022 1451      Wt Readings from Last 3 Encounters:  04/04/22 274 lb (124.3 kg)  03/31/22 284 lb (128.8 kg)  01/03/22 288 lb (130.6 kg)      Other studies Reviewed: Additional studies/ records that were reviewed today include: Labs. Review of the above records demonstrates:  Please see elsewhere in the note.     ASSESSMENT AND PLAN:  Syncope: I suspect the had hypotension in part related to the Viagra.  Because of the abnormal EKG with low voltage I will check an  echocardiogram.  I have suggested that he get a blood pressure cuff.  I would suggest he stay hydrated.  If however this is an isolated event and his echo is normal I would not suggest further work-up.  Morbid obesity: I congratulated him on his weight loss to date and encouraged more the same with calorie restriction and continued physical activity.  Abnormal EKG: See above   Current medicines are reviewed at length with the  patient today.  The patient does not have concerns regarding medicines.  The following changes have been made:  no change  Labs/ tests ordered today include: None  Orders Placed This Encounter  Procedures   EKG 12-Lead   ECHOCARDIOGRAM COMPLETE     Disposition:   FU with me as needed.      Signed, Minus Breeding, MD  04/04/2022 2:36 PM    [place]

## 2022-04-04 ENCOUNTER — Ambulatory Visit: Payer: BC Managed Care – PPO | Attending: Cardiology | Admitting: Cardiology

## 2022-04-04 ENCOUNTER — Encounter: Payer: Self-pay | Admitting: Cardiology

## 2022-04-04 VITALS — BP 126/78 | HR 67 | Ht 67.0 in | Wt 274.0 lb

## 2022-04-04 DIAGNOSIS — R55 Syncope and collapse: Secondary | ICD-10-CM | POA: Diagnosis not present

## 2022-04-04 NOTE — Patient Instructions (Signed)
Medication Instructions:  Your physician recommends that you continue on your current medications as directed. Please refer to the Current Medication list given to you today.   *If you need a refill on your cardiac medications before your next appointment, please call your pharmacy*   Lab Work: NONE ordered at this time of appointment   If you have labs (blood work) drawn today and your tests are completely normal, you will receive your results only by: MyChart Message (if you have MyChart) OR A paper copy in the mail If you have any lab test that is abnormal or we need to change your treatment, we will call you to review the results.   Testing/Procedures: Your physician has requested that you have an echocardiogram. Echocardiography is a painless test that uses sound waves to create images of your heart. It provides your doctor with information about the size and shape of your heart and how well your heart's chambers and valves are working. This procedure takes approximately one hour. There are no restrictions for this procedure. Please do NOT wear cologne, perfume, aftershave, or lotions (deodorant is allowed). Please arrive 15 minutes prior to your appointment time.    Follow-Up: At [facility], you and your health needs are our priority.  As part of our continuing mission to provide you with exceptional heart care, we have created designated Provider Care Teams.  These Care Teams include your primary Cardiologist (physician) and Advanced Practice Providers (APPs -  Physician Assistants and Nurse Practitioners) who all work together to provide you with the care you need, when you need it.  We recommend signing up for the patient portal called "MyChart".  Sign up information is provided on this After Visit Summary.  MyChart is used to connect with patients for Virtual Visits (Telemedicine).  Patients are able to view lab/test results, encounter notes, upcoming appointments, etc.   Non-urgent messages can be sent to your provider as well.   To learn more about what you can do with MyChart, go to [email or web address].    Your next appointment:    As needed   The format for your next appointment:   In Person  Provider:   Edmon Crape, MD  Other Instructions   Important Information About Sugar

## 2022-04-23 ENCOUNTER — Telehealth: Payer: Self-pay | Admitting: Family Medicine

## 2022-04-23 NOTE — Telephone Encounter (Signed)
Please change patient appointment to a in office visit it can not be a mychart visit.

## 2022-04-24 ENCOUNTER — Ambulatory Visit: Payer: BC Managed Care – PPO | Admitting: Family Medicine

## 2022-04-24 ENCOUNTER — Encounter: Payer: Self-pay | Admitting: Family Medicine

## 2022-04-24 ENCOUNTER — Ambulatory Visit: Payer: BC Managed Care – PPO | Attending: Cardiology

## 2022-04-24 VITALS — BP 128/78 | HR 68 | Ht 67.0 in | Wt 282.0 lb

## 2022-04-24 DIAGNOSIS — R55 Syncope and collapse: Secondary | ICD-10-CM

## 2022-04-24 DIAGNOSIS — J0100 Acute maxillary sinusitis, unspecified: Secondary | ICD-10-CM | POA: Diagnosis not present

## 2022-04-24 DIAGNOSIS — R051 Acute cough: Secondary | ICD-10-CM

## 2022-04-24 LAB — ECHOCARDIOGRAM COMPLETE
AR max vel: 2.73 cm2
AV Area VTI: 3.03 cm2
AV Area mean vel: 2.78 cm2
AV Mean grad: 3 mmHg
AV Peak grad: 6.2 mmHg
Ao pk vel: 1.24 m/s
Area-P 1/2: 3.7 cm2
S' Lateral: 3.3 cm

## 2022-04-24 MED ORDER — PERFLUTREN LIPID MICROSPHERE
1.0000 mL | INTRAVENOUS | Status: AC | PRN
  Administered 2022-04-24: 2 mL via INTRAVENOUS

## 2022-04-24 MED ORDER — AMOXICILLIN 500 MG PO CAPS: 500.0000 mg | ORAL_CAPSULE | Freq: Three times a day (TID) | ORAL | 0 refills | Status: AC

## 2022-04-24 MED ORDER — PROMETHAZINE-DM 6.25-15 MG/5ML PO SYRP: 5.0000 mL | ORAL_SOLUTION | Freq: Four times a day (QID) | ORAL | 0 refills | Status: DC | PRN

## 2022-04-24 NOTE — Progress Notes (Signed)
Date:  04/24/2022   Name:  Brandon Green   DOB:  July 02, 1970   MRN:  [id number]   Chief Complaint: Cough (X 3 days, No fever,chest congestion, headache sinus pressure, green/ yellow mucous, sore throat, vomited due to coughing, tried delsym it works some but not a lot )  Sinusitis This is a chronic problem. The current episode started more than 1 year ago. The problem has been gradually improving since onset. There has been no fever. Associated symptoms include congestion, coughing, headaches, sinus pressure and swollen glands. Pertinent negatives include no chills, shortness of breath or sneezing. (Maxillary discomfort) Past treatments include nothing.    Lab Results  Component Value Date   NA 137 01/03/2022   K 4.5 01/03/2022   CO2 25 01/03/2022   GLUCOSE 142 (H) 01/03/2022   BUN 12 01/03/2022   CREATININE 0.81 01/03/2022   CALCIUM 9.5 01/03/2022   EGFR 108 01/03/2022   GFRNONAA >60 10/25/2019   Lab Results  Component Value Date   CHOL 154 01/03/2022   HDL 35 (L) 01/03/2022   LDLCALC 97 01/03/2022   TRIG 123 01/03/2022   No results found for: "TSH" Lab Results  Component Value Date   HGBA1C 7.6 (H) 01/03/2022   No results found for: "WBC", "HGB", "HCT", "MCV", "PLT" Lab Results  Component Value Date   ALT 21 01/03/2022   AST 18 01/03/2022   ALKPHOS 74 01/03/2022   BILITOT 0.6 01/03/2022   No results found for: "25OHVITD2", "25OHVITD3", "VD25OH"   Review of Systems  Constitutional:  Negative for chills.  HENT:  Positive for congestion and sinus pressure. Negative for sneezing.   Respiratory:  Positive for cough. Negative for shortness of breath, wheezing and stridor.   Cardiovascular: Negative.   Gastrointestinal: Negative.   Neurological:  Positive for headaches.    Patient Active Problem List   Diagnosis Date Noted   Colon cancer screening    Adult BMI 40.0-44.9 kg/sq m ([place]) 11/22/2020   Plaque psoriasis 10/03/2019    Allergies  Allergen Reactions    Percocet [Oxycodone-Acetaminophen] Itching    Past Surgical History:  Procedure Laterality Date   COLONOSCOPY WITH PROPOFOL N/A 06/21/2021   Procedure: COLONOSCOPY WITH PROPOFOL;  Surgeon: [name], MD;  Location: [facility];  Service: Endoscopy;  Laterality: N/A;   FRACTURE SURGERY     right lower leg    Social History   Tobacco Use   Smoking status: Former    Packs/day: 0.25    Types: Cigarettes    Quit date: 06/19/2020    Years since quitting: 1.8   Smokeless tobacco: Never  Vaping Use   Vaping Use: Never used  Substance Use Topics   Alcohol use: Not Currently   Drug use: Never     Medication list has been reviewed and updated.  Current Meds  Medication Sig   atorvastatin (LIPITOR) 10 MG tablet Take 1 tablet (10 mg total) by mouth daily.   Blood Glucose Monitoring Suppl (FREESTYLE FREEDOM) KIT 1 Units by Does not apply route in the morning and at bedtime.   clobetasol ointment (TEMOVATE) 0.05 % Apply topically.   glucose blood test strip Check your sugar in the morning before you eat breakfast, and one hour after a meal.   ibuprofen (ADVIL) 600 MG tablet Take 1 tablet (600 mg total) by mouth every 6 (six) hours as needed.   losartan (COZAAR) 25 MG tablet Take 1 tablet (25 mg total) by mouth daily.   metFORMIN (GLUCOPHAGE-XR)  750 MG 24 hr tablet Take 750 mg by mouth daily with breakfast.   RYBELSUS 7 MG TABS Take 1 tablet by mouth daily.   sildenafil (VIAGRA) 100 MG tablet TAKE 1 TABLET BY MOUTH AS NEEDED FOR ERECTILE DYSFUNCTION   [DISCONTINUED] Semaglutide 3 MG TABS Take 1 tablet by mouth daily.       04/24/2022    4:19 PM 03/31/2022    1:38 PM 01/03/2022    9:59 AM 05/27/2021    8:28 AM  GAD 7 : Generalized Anxiety Score  Nervous, Anxious, on Edge 0 0 0 0  Control/stop worrying 0 0 0 0  Worry too much - different things 0 0 0 0  Trouble relaxing 0 0 0 0  Restless 0 0 0 0  Easily annoyed or irritable 0 0 0 0  Afraid - awful might happen 0 0 0 0   Total GAD 7 Score 0 0 0 0  Anxiety Difficulty Not difficult at all Not difficult at all Not difficult at all Not difficult at all       04/24/2022    4:19 PM 03/31/2022    1:37 PM 01/03/2022    9:58 AM  Depression screen PHQ 2/9  Decreased Interest 0 0 0  Down, Depressed, Hopeless 0 0 0  PHQ - 2 Score 0 0 0  Altered sleeping 0 0 0  Tired, decreased energy 0 0 0  Change in appetite 0 0 0  Feeling bad or failure about yourself  0 0 0  Trouble concentrating 0 0 0  Moving slowly or fidgety/restless 0 0 0  Suicidal thoughts 0 0 0  PHQ-9 Score 0 0 0  Difficult doing work/chores Not difficult at all Not difficult at all Not difficult at all    BP Readings from Last 3 Encounters:  04/24/22 128/78  04/04/22 126/78  03/31/22 128/76    Physical Exam Vitals and nursing note reviewed.  HENT:     Head: Normocephalic.     Right Ear: Tympanic membrane and external ear normal.     Left Ear: Tympanic membrane and external ear normal.     Nose:     Right Sinus: Maxillary sinus tenderness present. No frontal sinus tenderness.     Left Sinus: Maxillary sinus tenderness present. No frontal sinus tenderness.     Mouth/Throat:     Mouth: Mucous membranes are moist.  Eyes:     General: No scleral icterus.       Right eye: No discharge.        Left eye: No discharge.     Conjunctiva/sclera: Conjunctivae normal.     Pupils: Pupils are equal, round, and reactive to light.  Neck:     Thyroid: No thyromegaly.     Vascular: No JVD.     Trachea: No tracheal deviation.  Cardiovascular:     Rate and Rhythm: Normal rate and regular rhythm.     Heart sounds: Normal heart sounds. No murmur heard.    No friction rub. No gallop.  Pulmonary:     Effort: No respiratory distress.     Breath sounds: Normal breath sounds. No wheezing, rhonchi or rales.  Abdominal:     General: Bowel sounds are normal.     Palpations: Abdomen is soft. There is no mass.     Tenderness: There is no abdominal  tenderness. There is no guarding or rebound.  Musculoskeletal:        General: No tenderness. Normal range of motion.  Cervical back: Normal range of motion and neck supple.  Lymphadenopathy:     Cervical: No cervical adenopathy.  Skin:    General: Skin is warm.     Findings: No rash.  Neurological:     Mental Status: He is alert.     Wt Readings from Last 3 Encounters:  04/24/22 282 lb (127.9 kg)  04/04/22 274 lb (124.3 kg)  03/31/22 284 lb (128.8 kg)    BP 128/78   Pulse 68   Ht _0  (1.702 m)   Wt 282 lb (127.9 kg)   SpO2 97%   BMI 44.17 kg/m   Assessment and Plan:  1. Acute maxillary sinusitis, recurrence not specified New onset.  Persistent.  Stable.  Patient has outbreak of infection, upper respiratory in nature with tenderness over the maxillary sinuses bilateral consistent with an acute maxillary sinusitis.  We will treat with amoxicillin 500 mg 3 times a day for 10 days. - amoxicillin (AMOXIL) 500 MG capsule; Take 1 capsule (500 mg total) by mouth 3 (three) times daily for 10 days.  Dispense: 30 capsule; Refill: 0  2. Acute cough Cough is particularly bothersome at night so we will use promethazine with dextromethorphan at night and Mucinex DM during the day.  New onset.  Persistent.  Stable.  Patient has outbreak of infection of an upper respiratory nature and with tenderness over the maxillary sinuses bilateral consistent with an acute maxillary sinusitis.   Otilio Miu, MD

## 2022-04-29 DIAGNOSIS — E1165 Type 2 diabetes mellitus with hyperglycemia: Secondary | ICD-10-CM | POA: Diagnosis not present

## 2022-04-30 ENCOUNTER — Encounter: Payer: Self-pay | Admitting: *Deleted

## 2022-05-05 ENCOUNTER — Ambulatory Visit: Payer: BC Managed Care – PPO | Admitting: Family Medicine

## 2022-05-06 ENCOUNTER — Telehealth: Payer: Self-pay

## 2022-05-06 DIAGNOSIS — E669 Obesity, unspecified: Secondary | ICD-10-CM | POA: Diagnosis not present

## 2022-05-06 DIAGNOSIS — E1169 Type 2 diabetes mellitus with other specified complication: Secondary | ICD-10-CM | POA: Diagnosis not present

## 2022-05-06 NOTE — Telephone Encounter (Addendum)
Pt returned Tara's call and I gave him the message below. Pt stated his meter was left in [place] when he went for a funeral, and he would request a new one at his appointment today.  Please advise.

## 2022-05-06 NOTE — Telephone Encounter (Signed)
Called both pt and wife- left message on wife's cell phone stating appt today at endo in [place] at 3:00 bring meter with readings and it is a follow up to starting Rybelsus.

## 2022-05-30 LAB — HM DIABETES EYE EXAM

## 2022-06-09 ENCOUNTER — Ambulatory Visit: Payer: Self-pay | Admitting: Family Medicine

## 2022-06-13 ENCOUNTER — Encounter: Payer: Self-pay | Admitting: Family Medicine

## 2022-06-13 ENCOUNTER — Ambulatory Visit: Payer: BC Managed Care – PPO | Admitting: Family Medicine

## 2022-06-13 DIAGNOSIS — I10 Essential (primary) hypertension: Secondary | ICD-10-CM

## 2022-06-13 DIAGNOSIS — E7801 Familial hypercholesterolemia: Secondary | ICD-10-CM

## 2022-06-13 MED ORDER — LOSARTAN POTASSIUM 25 MG PO TABS: 25.0000 mg | ORAL_TABLET | Freq: Every day | ORAL | 1 refills | Status: DC

## 2022-06-13 MED ORDER — ATORVASTATIN CALCIUM 10 MG PO TABS: 10.0000 mg | ORAL_TABLET | Freq: Every day | ORAL | 1 refills | Status: DC

## 2022-06-13 NOTE — Progress Notes (Addendum)
Date:  06/13/2022   Name:  Brandon Green   DOB:  [birth year]   MRN:  [id number]   Chief Complaint: Hypertension  Hypertension This is a chronic problem. The current episode started more than 1 year ago. The problem has been gradually improving since onset. The problem is controlled. Pertinent negatives include no anxiety, blurred vision, chest pain, headaches, orthopnea, palpitations, PND or shortness of breath. There are no associated agents to hypertension. Risk factors for coronary artery disease include dyslipidemia. Past treatments include angiotensin blockers. The current treatment provides moderate improvement. There are no compliance problems.  There is no history of angina, kidney disease, CAD/MI, CVA, heart failure, left ventricular hypertrophy, PVD or retinopathy. There is no history of chronic renal disease, a hypertension causing med or renovascular disease.  Hyperlipidemia This is a chronic problem. The current episode started more than 1 year ago. The problem is controlled. Recent lipid tests were reviewed and are normal. He has no history of chronic renal disease. Pertinent negatives include no chest pain or shortness of breath. Current antihyperlipidemic treatment includes statins. The current treatment provides moderate improvement of lipids.    Lab Results  Component Value Date   NA 137 01/03/2022   K 4.5 01/03/2022   CO2 25 01/03/2022   GLUCOSE 142 (H) 01/03/2022   BUN 12 01/03/2022   CREATININE 0.81 01/03/2022   CALCIUM 9.5 01/03/2022   EGFR 108 01/03/2022   GFRNONAA >60 10/25/2019   Lab Results  Component Value Date   CHOL 154 01/03/2022   HDL 35 (L) 01/03/2022   LDLCALC 97 01/03/2022   TRIG 123 01/03/2022   No results found for: "TSH" Lab Results  Component Value Date   HGBA1C 7.6 (H) 01/03/2022   No results found for: "WBC", "HGB", "HCT", "MCV", "PLT" Lab Results  Component Value Date   ALT 21 01/03/2022   AST 18 01/03/2022   ALKPHOS 74 01/03/2022    BILITOT 0.6 01/03/2022   No results found for: "25OHVITD2", "25OHVITD3", "VD25OH"   Review of Systems  Eyes:  Negative for blurred vision.  Respiratory:  Negative for shortness of breath.   Cardiovascular:  Negative for chest pain, palpitations, orthopnea and PND.  Neurological:  Negative for headaches.    Patient Active Problem List   Diagnosis Date Noted   Colon cancer screening    Adult BMI 40.0-44.9 kg/sq m ([place]) 11/22/2020   Plaque psoriasis 10/03/2019    Allergies  Allergen Reactions   Percocet [Oxycodone-Acetaminophen] Itching    Past Surgical History:  Procedure Laterality Date   COLONOSCOPY WITH PROPOFOL N/A 06/21/2021   Procedure: COLONOSCOPY WITH PROPOFOL;  Surgeon: [name], MD;  Location: [facility];  Service: Endoscopy;  Laterality: N/A;   FRACTURE SURGERY     right lower leg    Social History   Tobacco Use   Smoking status: Former    Packs/day: 0.25    Types: Cigarettes    Quit date: 06/19/2020    Years since quitting: 1.9   Smokeless tobacco: Never  Vaping Use   Vaping Use: Never used  Substance Use Topics   Alcohol use: Not Currently   Drug use: Never     Medication list has been reviewed and updated.  Current Meds  Medication Sig   atorvastatin (LIPITOR) 10 MG tablet Take 1 tablet (10 mg total) by mouth daily.   Blood Glucose Monitoring Suppl (FREESTYLE FREEDOM) KIT 1 Units by Does not apply route in the morning and at bedtime.  clobetasol ointment (TEMOVATE) 0.05 % Apply topically.   glucose blood test strip Check your sugar in the morning before you eat breakfast, and one hour after a meal.   ibuprofen (ADVIL) 600 MG tablet Take 1 tablet (600 mg total) by mouth every 6 (six) hours as needed.   losartan (COZAAR) 25 MG tablet Take 1 tablet (25 mg total) by mouth daily.   metFORMIN (GLUCOPHAGE-XR) 750 MG 24 hr tablet Take 750 mg by mouth daily with breakfast.   RYBELSUS 7 MG TABS Take 1 tablet by mouth daily.   sildenafil  (VIAGRA) 100 MG tablet TAKE 1 TABLET BY MOUTH AS NEEDED FOR ERECTILE DYSFUNCTION       06/13/2022    3:28 PM 04/24/2022    4:19 PM 03/31/2022    1:38 PM 01/03/2022    9:59 AM  GAD 7 : Generalized Anxiety Score  Nervous, Anxious, on Edge 0 0 0 0  Control/stop worrying 0 0 0 0  Worry too much - different things 0 0 0 0  Trouble relaxing 0 0 0 0  Restless 0 0 0 0  Easily annoyed or irritable 0 0 0 0  Afraid - awful might happen 0 0 0 0  Total GAD 7 Score 0 0 0 0  Anxiety Difficulty Not difficult at all Not difficult at all Not difficult at all Not difficult at all       06/13/2022    3:28 PM 04/24/2022    4:19 PM 03/31/2022    1:37 PM  Depression screen PHQ 2/9  Decreased Interest 0 0 0  Down, Depressed, Hopeless 0 0 0  PHQ - 2 Score 0 0 0  Altered sleeping  0 0  Tired, decreased energy  0 0  Change in appetite  0 0  Feeling bad or failure about yourself   0 0  Trouble concentrating  0 0  Moving slowly or fidgety/restless  0 0  Suicidal thoughts  0 0  PHQ-9 Score  0 0  Difficult doing work/chores Not difficult at all Not difficult at all Not difficult at all    BP Readings from Last 3 Encounters:  06/13/22 128/72  04/24/22 128/78  04/04/22 126/78    Physical Exam Vitals and nursing note reviewed.  HENT:     Head: Normocephalic.     Right Ear: Tympanic membrane and external ear normal.     Left Ear: Tympanic membrane and external ear normal.     Nose: Nose normal. No congestion or rhinorrhea.     Mouth/Throat:     Mouth: Mucous membranes are moist.  Eyes:     General: No scleral icterus.       Right eye: No discharge.        Left eye: No discharge.     Conjunctiva/sclera: Conjunctivae normal.     Pupils: Pupils are equal, round, and reactive to light.  Neck:     Thyroid: No thyromegaly.     Vascular: No JVD.     Trachea: No tracheal deviation.  Cardiovascular:     Rate and Rhythm: Normal rate and regular rhythm.     Heart sounds: Normal heart sounds. No  murmur heard.    No friction rub. No gallop.  Pulmonary:     Effort: No respiratory distress.     Breath sounds: Normal breath sounds. No wheezing, rhonchi or rales.  Abdominal:     General: Bowel sounds are normal.     Palpations: Abdomen is soft. There is  no mass.     Tenderness: There is no abdominal tenderness. There is no guarding or rebound.  Musculoskeletal:        General: No tenderness. Normal range of motion.     Cervical back: Normal range of motion and neck supple.  Lymphadenopathy:     Cervical: No cervical adenopathy.  Skin:    General: Skin is warm.     Findings: No rash.  Neurological:     Mental Status: He is alert.     Wt Readings from Last 3 Encounters:  06/13/22 285 lb (129.3 kg)  04/24/22 282 lb (127.9 kg)  04/04/22 274 lb (124.3 kg)    BP 128/72   Pulse 72   Ht 5\' 7"  (1.702 m)   Wt 285 lb (129.3 kg)   SpO2 97%   BMI 44.64 kg/m   Assessment and Plan:  1. Familial hypercholesterolemia Chronic.  Controlled.  Stable.  Continue atorvastatin 10 mg once a day.  Will recheck patient in 6 months. - atorvastatin (LIPITOR) 10 MG tablet; Take 1 tablet (10 mg total) by mouth daily.  Dispense: 90 tablet; Refill: 1  2. Primary hypertension Chronic.  Controlled.  Stable.  Blood pressure 128/72.  Asymptomatic.  Tolerating medication.  Continue losartan 25 mg once a day.  Will recheck patient in 6 months.  Review previous labs acceptable from the December 2023. - losartan (COZAAR) 25 MG tablet; Take 1 tablet (25 mg total) by mouth daily.  Dispense: 90 tablet; Refill: 1    Otilio Miu, MD

## 2022-09-02 DIAGNOSIS — E1165 Type 2 diabetes mellitus with hyperglycemia: Secondary | ICD-10-CM | POA: Diagnosis not present

## 2022-09-02 DIAGNOSIS — E1169 Type 2 diabetes mellitus with other specified complication: Secondary | ICD-10-CM | POA: Diagnosis not present

## 2022-09-02 DIAGNOSIS — E669 Obesity, unspecified: Secondary | ICD-10-CM | POA: Diagnosis not present

## 2022-11-05 ENCOUNTER — Other Ambulatory Visit: Payer: Self-pay | Admitting: Family Medicine

## 2022-11-05 DIAGNOSIS — N521 Erectile dysfunction due to diseases classified elsewhere: Secondary | ICD-10-CM

## 2022-11-06 NOTE — Telephone Encounter (Signed)
Keep appt July 26

## 2022-12-12 ENCOUNTER — Ambulatory Visit: Payer: BC Managed Care – PPO | Admitting: Family Medicine

## 2023-01-27 ENCOUNTER — Telehealth: Payer: Self-pay | Admitting: Family Medicine

## 2023-01-27 ENCOUNTER — Other Ambulatory Visit: Payer: Self-pay

## 2023-01-27 DIAGNOSIS — E78019 Familial hypercholesterolemia, unspecified: Secondary | ICD-10-CM

## 2023-01-27 DIAGNOSIS — I10 Essential (primary) hypertension: Secondary | ICD-10-CM

## 2023-01-27 DIAGNOSIS — E7801 Familial hypercholesterolemia: Secondary | ICD-10-CM

## 2023-01-27 MED ORDER — LOSARTAN POTASSIUM 25 MG PO TABS
25.0000 mg | ORAL_TABLET | Freq: Every day | ORAL | 0 refills | Status: DC
Start: 2023-01-27 — End: 2023-03-24

## 2023-01-27 MED ORDER — ATORVASTATIN CALCIUM 10 MG PO TABS
10.0000 mg | ORAL_TABLET | Freq: Every day | ORAL | 0 refills | Status: DC
Start: 2023-01-27 — End: 2023-03-24

## 2023-01-27 NOTE — Telephone Encounter (Signed)
Copied from CRM [phone]. Topic: General - Other >> Jan 27, 2023  3:51 PM Ja-Kwan M wrote: Reason for CRM: Pt stated he currently does not have insurance because he changed jobs. Pt requests call back to advise if there are any coupons that he can have for his medications. Pt requests call back to advise. Cb# [phone]

## 2023-01-27 NOTE — Telephone Encounter (Signed)
Called patient and told him that there is no coupons for his medications. Recommended using [facility] for coupons. He asked for a refill on atorvastatin, and losartan. He is scheduled for an appointment with Dr Yetta Barre in November. Sent in #[phone] with 0 refills.  - Rayonna Heldman

## 2023-03-24 ENCOUNTER — Encounter: Payer: Self-pay | Admitting: Family Medicine

## 2023-03-24 ENCOUNTER — Ambulatory Visit (INDEPENDENT_AMBULATORY_CARE_PROVIDER_SITE_OTHER): Payer: No Typology Code available for payment source | Admitting: Family Medicine

## 2023-03-24 VITALS — BP 102/76 | HR 119 | Ht 67.0 in | Wt 287.0 lb

## 2023-03-24 DIAGNOSIS — E7801 Familial hypercholesterolemia: Secondary | ICD-10-CM

## 2023-03-24 DIAGNOSIS — I10 Essential (primary) hypertension: Secondary | ICD-10-CM | POA: Diagnosis not present

## 2023-03-24 DIAGNOSIS — N521 Erectile dysfunction due to diseases classified elsewhere: Secondary | ICD-10-CM | POA: Diagnosis not present

## 2023-03-24 MED ORDER — SILDENAFIL CITRATE 100 MG PO TABS
100.0000 mg | ORAL_TABLET | ORAL | 5 refills | Status: AC | PRN
Start: 2023-03-24 — End: ?

## 2023-03-24 MED ORDER — ATORVASTATIN CALCIUM 10 MG PO TABS
10.0000 mg | ORAL_TABLET | Freq: Every day | ORAL | 1 refills | Status: DC
Start: 2023-03-24 — End: 2023-12-23

## 2023-03-24 MED ORDER — LOSARTAN POTASSIUM 25 MG PO TABS
25.0000 mg | ORAL_TABLET | Freq: Every day | ORAL | 1 refills | Status: DC
Start: 2023-03-24 — End: 2023-12-23

## 2023-03-24 NOTE — Progress Notes (Signed)
Date:  03/24/2023   Name:  Brandon Green   DOB:  [birth year]   MRN:  [id number]   Chief Complaint: Hypertension, Hyperlipidemia, and Erectile Dysfunction  Hypertension This is a chronic problem. The current episode started more than 1 year ago. The problem has been gradually improving since onset. The problem is controlled. Pertinent negatives include no anxiety, blurred vision, chest pain, headaches, malaise/fatigue, neck pain, orthopnea, palpitations, peripheral edema, PND, shortness of breath or sweats. There are no associated agents to hypertension. There are no known risk factors for coronary artery disease. Past treatments include angiotensin blockers. There are no compliance problems.  There is no history of angina, CAD/MI, CVA, left ventricular hypertrophy or retinopathy. There is no history of chronic renal disease, a hypertension causing med or renovascular disease.  Hyperlipidemia This is a chronic problem. The current episode started more than 1 year ago. The problem is controlled. Recent lipid tests were reviewed and are normal. He has no history of chronic renal disease. Pertinent negatives include no chest pain, focal sensory loss, focal weakness, leg pain, myalgias or shortness of breath. Current antihyperlipidemic treatment includes statins. The current treatment provides moderate improvement of lipids. There are no compliance problems.  Risk factors for coronary artery disease include dyslipidemia and hypertension.  Erectile Dysfunction This is a chronic problem. The current episode started more than 1 year ago. The problem has been gradually improving since onset. The nature of his difficulty is achieving erection. Irritative symptoms do not include frequency, nocturia or urgency. Past treatments include sildenafil. The treatment provided moderate relief.    Lab Results  Component Value Date   NA 137 01/03/2022   K 4.5 01/03/2022   CO2 25 01/03/2022   GLUCOSE 142 (H)  01/03/2022   BUN 12 01/03/2022   CREATININE 0.81 01/03/2022   CALCIUM 9.5 01/03/2022   EGFR 108 01/03/2022   GFRNONAA >60 10/25/2019   Lab Results  Component Value Date   CHOL 154 01/03/2022   HDL 35 (L) 01/03/2022   LDLCALC 97 01/03/2022   TRIG 123 01/03/2022   No results found for: "TSH" Lab Results  Component Value Date   HGBA1C 7.6 (H) 01/03/2022   No results found for: "WBC", "HGB", "HCT", "MCV", "PLT" Lab Results  Component Value Date   ALT 21 01/03/2022   AST 18 01/03/2022   ALKPHOS 74 01/03/2022   BILITOT 0.6 01/03/2022   No results found for: "25OHVITD2", "25OHVITD3", "VD25OH"   Review of Systems  Constitutional:  Negative for malaise/fatigue.  HENT:  Negative for congestion.   Eyes:  Negative for blurred vision and visual disturbance.  Respiratory:  Negative for cough, choking, chest tightness, shortness of breath and wheezing.   Cardiovascular:  Negative for chest pain, palpitations, orthopnea and PND.  Genitourinary:  Negative for frequency, nocturia and urgency.  Musculoskeletal:  Negative for myalgias and neck pain.  Neurological:  Negative for focal weakness and headaches.    Patient Active Problem List   Diagnosis Date Noted   Colon cancer screening    Adult BMI 40.0-44.9 kg/sq m (HCC) 11/22/2020   Plaque psoriasis 10/03/2019    Allergies  Allergen Reactions   Percocet [Oxycodone-Acetaminophen] Itching    Past Surgical History:  Procedure Laterality Date   COLONOSCOPY WITH PROPOFOL N/A 06/21/2021   Procedure: COLONOSCOPY WITH PROPOFOL;  Surgeon: Midge Minium, MD;  Location: [facility];  Service: Endoscopy;  Laterality: N/A;   FRACTURE SURGERY     right lower leg    Social  History   Tobacco Use   Smoking status: Former    Current packs/day: 0.00    Types: Cigarettes    Quit date: 06/19/2020    Years since quitting: 2.7   Smokeless tobacco: Never  Vaping Use   Vaping status: Never Used  Substance Use Topics   Alcohol use: Not  Currently   Drug use: Never     Medication list has been reviewed and updated.  Current Meds  Medication Sig   atorvastatin (LIPITOR) 10 MG tablet Take 1 tablet (10 mg total) by mouth daily.   Blood Glucose Monitoring Suppl (FREESTYLE FREEDOM) KIT 1 Units by Does not apply route in the morning and at bedtime.   glucose blood test strip Check your sugar in the morning before you eat breakfast, and one hour after a meal.   hydrOXYzine (ATARAX) 25 MG tablet Take 25 mg by mouth at bedtime.   ibuprofen (ADVIL) 600 MG tablet Take 1 tablet (600 mg total) by mouth every 6 (six) hours as needed.   losartan (COZAAR) 25 MG tablet Take 1 tablet (25 mg total) by mouth daily.   metFORMIN (GLUCOPHAGE-XR) 750 MG 24 hr tablet Take 750 mg by mouth daily with breakfast.   Semaglutide 14 MG TABS Take 1 tablet by mouth daily.   sildenafil (VIAGRA) 100 MG tablet TAKE 1 TABLET BY MOUTH AS NEEDED FOR ERECTILE DYSFUNCTION   terbinafine (LAMISIL) 250 MG tablet Take 250 mg by mouth daily.   [DISCONTINUED] clobetasol ointment (TEMOVATE) 0.05 % Apply topically.       03/24/2023    2:10 PM 06/13/2022    3:28 PM 04/24/2022    4:19 PM 03/31/2022    1:38 PM  GAD 7 : Generalized Anxiety Score  Nervous, Anxious, on Edge 0 0 0 0  Control/stop worrying 0 0 0 0  Worry too much - different things 0 0 0 0  Trouble relaxing 0 0 0 0  Restless 0 0 0 0  Easily annoyed or irritable 0 0 0 0  Afraid - awful might happen 0 0 0 0  Total GAD 7 Score 0 0 0 0  Anxiety Difficulty Not difficult at all Not difficult at all Not difficult at all Not difficult at all       03/24/2023    2:10 PM 06/13/2022    3:28 PM 04/24/2022    4:19 PM  Depression screen PHQ 2/9  Decreased Interest 0 0 0  Down, Depressed, Hopeless 0 0 0  PHQ - 2 Score 0 0 0  Altered sleeping 0  0  Tired, decreased energy 0  0  Change in appetite 0  0  Feeling bad or failure about yourself  0  0  Trouble concentrating 0  0  Moving slowly or fidgety/restless  0  0  Suicidal thoughts 0  0  PHQ-9 Score 0  0  Difficult doing work/chores Not difficult at all Not difficult at all Not difficult at all    BP Readings from Last 3 Encounters:  03/24/23 102/76  06/13/22 128/72  04/24/22 128/78    Physical Exam Vitals and nursing note reviewed.  HENT:     Head: Normocephalic.     Right Ear: Tympanic membrane and external ear normal.     Left Ear: Tympanic membrane and external ear normal.     Nose: Nose normal.  Eyes:     General: No scleral icterus.       Right eye: No discharge.  Left eye: No discharge.     Conjunctiva/sclera: Conjunctivae normal.     Pupils: Pupils are equal, round, and reactive to light.  Neck:     Thyroid: No thyromegaly.     Vascular: No JVD.     Trachea: No tracheal deviation.  Cardiovascular:     Rate and Rhythm: Normal rate and regular rhythm.     Heart sounds: Normal heart sounds, S1 normal and S2 normal. No murmur heard.    No systolic murmur is present.     No diastolic murmur is present.     No friction rub. No gallop. No S3 or S4 sounds.  Pulmonary:     Effort: No respiratory distress.     Breath sounds: Normal breath sounds. No wheezing or rales.  Abdominal:     General: Bowel sounds are normal.     Palpations: Abdomen is soft. There is no hepatomegaly, splenomegaly or mass.     Tenderness: There is no abdominal tenderness. There is no guarding or rebound.  Musculoskeletal:        General: No tenderness. Normal range of motion.     Cervical back: Normal range of motion and neck supple.     Right lower leg: No edema.     Left lower leg: No edema.  Lymphadenopathy:     Cervical: No cervical adenopathy.  Skin:    General: Skin is warm.     Findings: No rash.  Neurological:     Mental Status: He is alert and oriented to person, place, and time.     Cranial Nerves: No cranial nerve deficit.     Deep Tendon Reflexes: Reflexes are normal and symmetric.     Wt Readings from Last 3 Encounters:   03/24/23 287 lb (130.2 kg)  06/13/22 285 lb (129.3 kg)  04/24/22 282 lb (127.9 kg)    BP 102/76   Pulse (!) 119   Ht 5\' 7"  (1.702 m)   Wt 287 lb (130.2 kg)   SpO2 97%   BMI 44.95 kg/m   Assessment and Plan:  1. Primary hypertension Chronic.  Controlled.  Stable.  Blood pressure 102/76.  Tolerating medication well without side effects.  Asymptomatic without symptomatology without dizziness without chest pain shortness of breath.  Continue losartan 25 mg once a day.  Will check renal function panel for electrolytes and GFR.  Will recheck patient in 6 months. - losartan (COZAAR) 25 MG tablet; Take 1 tablet (25 mg total) by mouth daily.  Dispense: 90 tablet; Refill: 1 - Renal Function Panel  2. Familial hypercholesterolemia .  Controlled.  Stable.  Continue atorvastatin 10 mg once a day.  Patient is asymptomatic without myalgias.  Continue with dietary approach as well and they have been given as guidelines.  Will recheck patient in 6 months.  Will check lipid panel for current level of LDL control. - atorvastatin (LIPITOR) 10 MG tablet; Take 1 tablet (10 mg total) by mouth daily.  Dispense: 90 tablet; Refill: 1 - Lipid Panel With LDL/HDL Ratio  3. Erectile dysfunction due to diseases classified elsewhere Chronic.  Controlled.  Stable.  Patient is having excellent results with current dosing of Viagra 100 mg 1 as needed as needed patient has been given 10 tablets a month with 5 refills we will recheck in 6 months.  Patient demonstrates no symptomatology such as dizziness or near syncopal episodes when taking medication. - sildenafil (VIAGRA) 100 MG tablet; Take 1 tablet (100 mg total) by mouth as needed for  erectile dysfunction.  Dispense: 10 tablet; Refill: 5    Elizabeth Sauer, MD

## 2023-03-24 NOTE — Patient Instructions (Signed)

## 2023-03-25 ENCOUNTER — Encounter: Payer: Self-pay | Admitting: Family Medicine

## 2023-03-25 LAB — RENAL FUNCTION PANEL
Albumin: 3.8 g/dL — ABNORMAL LOW (ref 4.1–5.1)
BUN/Creatinine Ratio: 14 (ref 9–20)
BUN: 11 mg/dL (ref 6–24)
CO2: 23 mmol/L (ref 20–29)
Calcium: 9.1 mg/dL (ref 8.7–10.2)
Chloride: 101 mmol/L (ref 96–106)
Creatinine, Ser: 0.79 mg/dL (ref 0.76–1.27)
Glucose: 244 mg/dL — ABNORMAL HIGH (ref 70–99)
Phosphorus: 3.1 mg/dL (ref 2.8–4.1)
Potassium: 4.3 mmol/L (ref 3.5–5.2)
Sodium: 137 mmol/L (ref 134–144)
eGFR: 108 mL/min/{1.73_m2} (ref >59)

## 2023-03-25 LAB — LIPID PANEL WITH LDL/HDL RATIO
Cholesterol, Total: 134 mg/dL (ref 100–199)
HDL: 29 mg/dL — ABNORMAL LOW (ref >39)
LDL Chol Calc (NIH): 68 mg/dL (ref 0–99)
LDL/HDL Ratio: 2.3 ratio (ref 0.0–3.6)
Triglycerides: 223 mg/dL — ABNORMAL HIGH (ref 0–149)
VLDL Cholesterol Cal: 37 mg/dL (ref 5–40)

## 2023-06-30 IMAGING — CR DG ABDOMEN 2V
4 series · 4 of 4 positions shown · non-contrast
Comparison: None.

CLINICAL DATA: Vomiting for proximally 2 weeks.

EXAM:
ABDOMEN - 2 VIEW

[abdomen erect (1 of 2)]
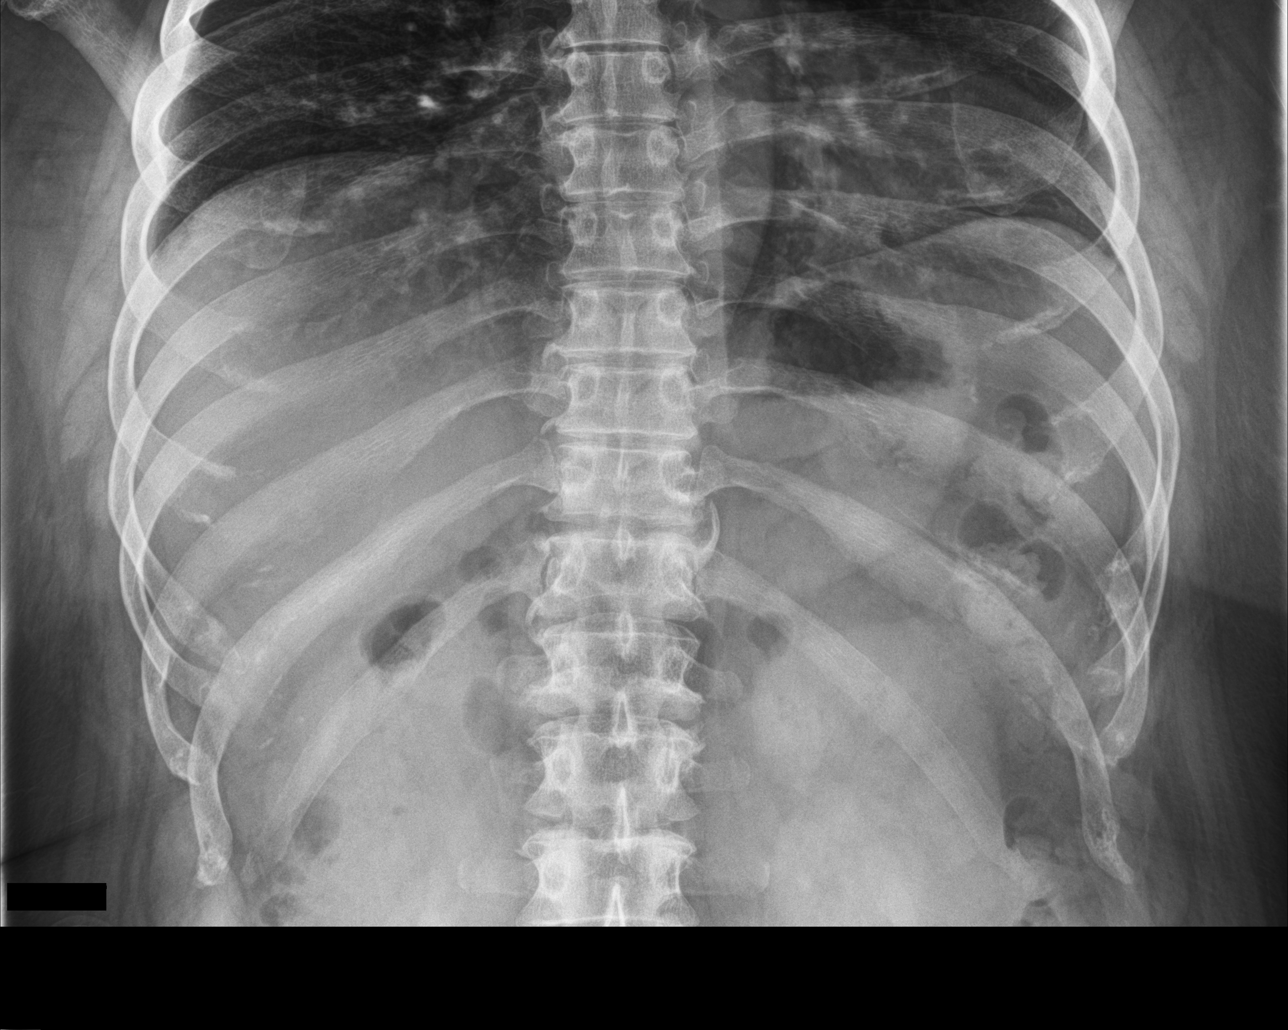

[abdomen erect (2 of 2)]
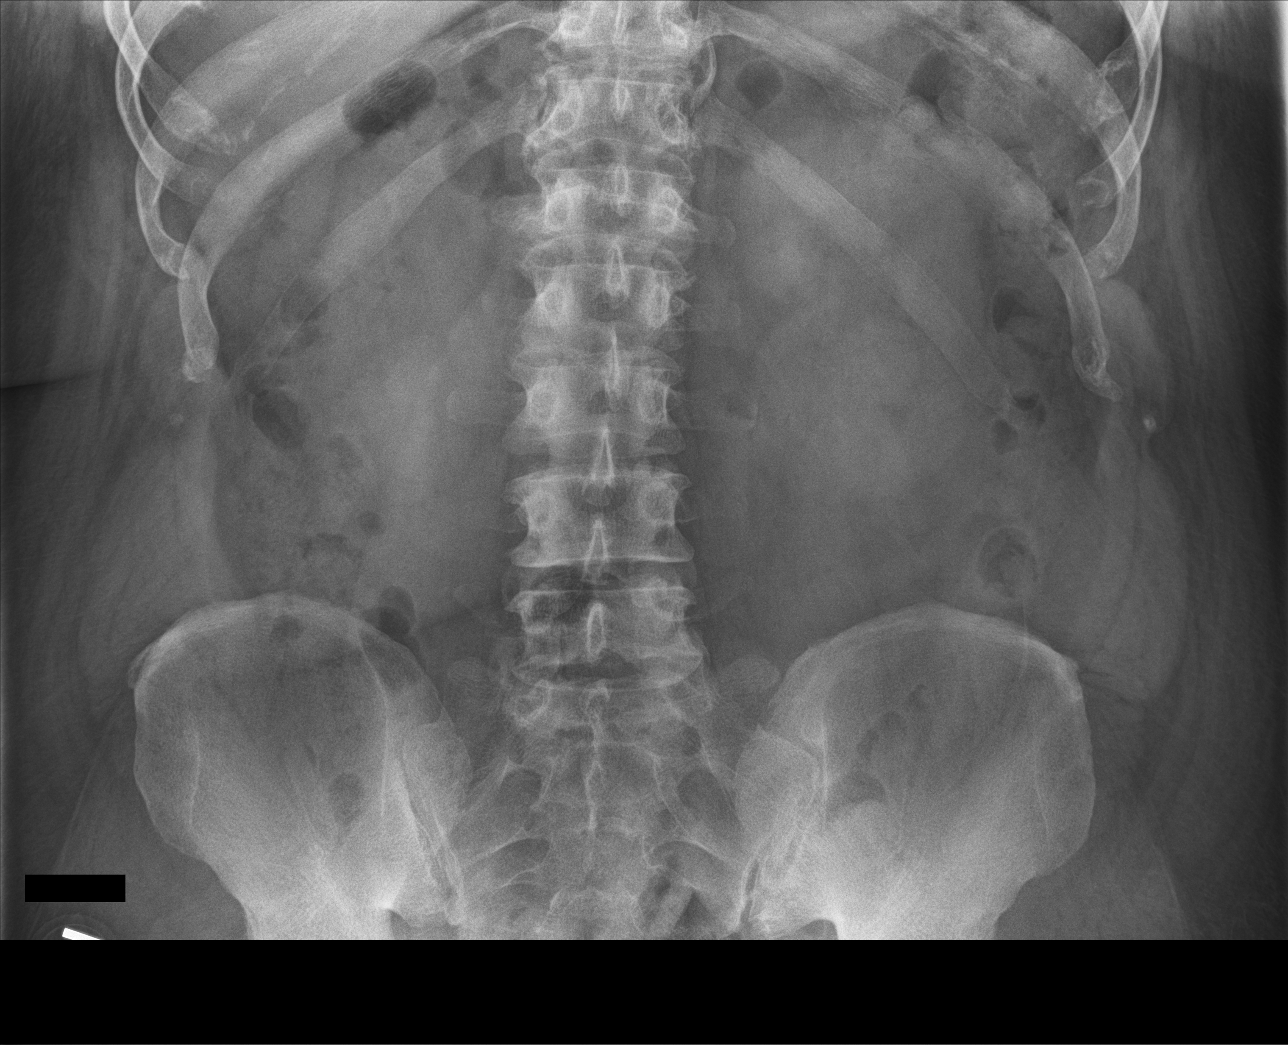

[abdomen supine (1 of 2)]
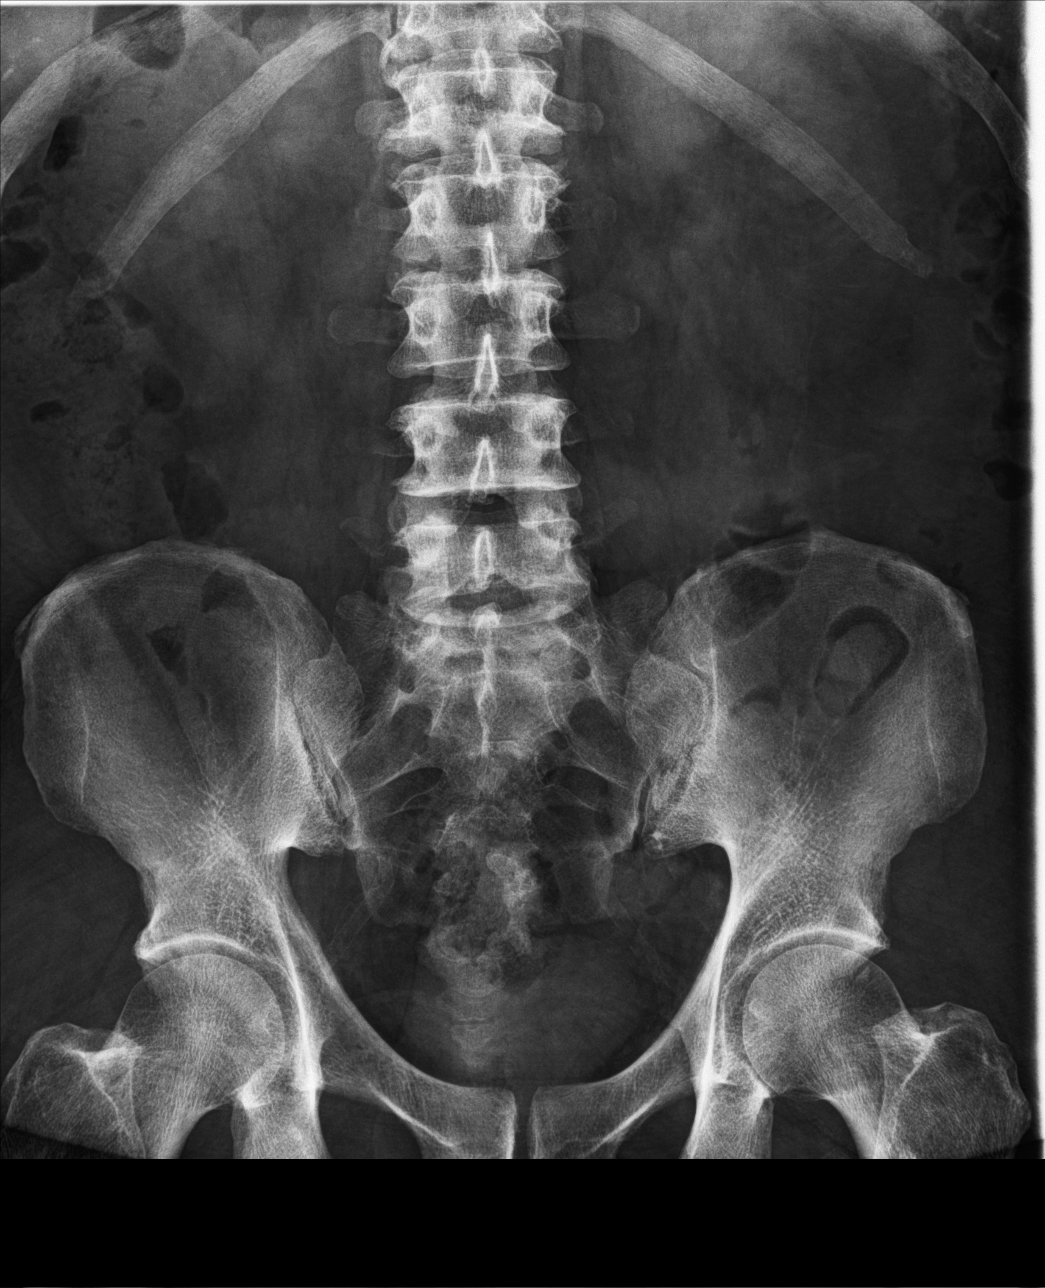

[abdomen supine (2 of 2)]
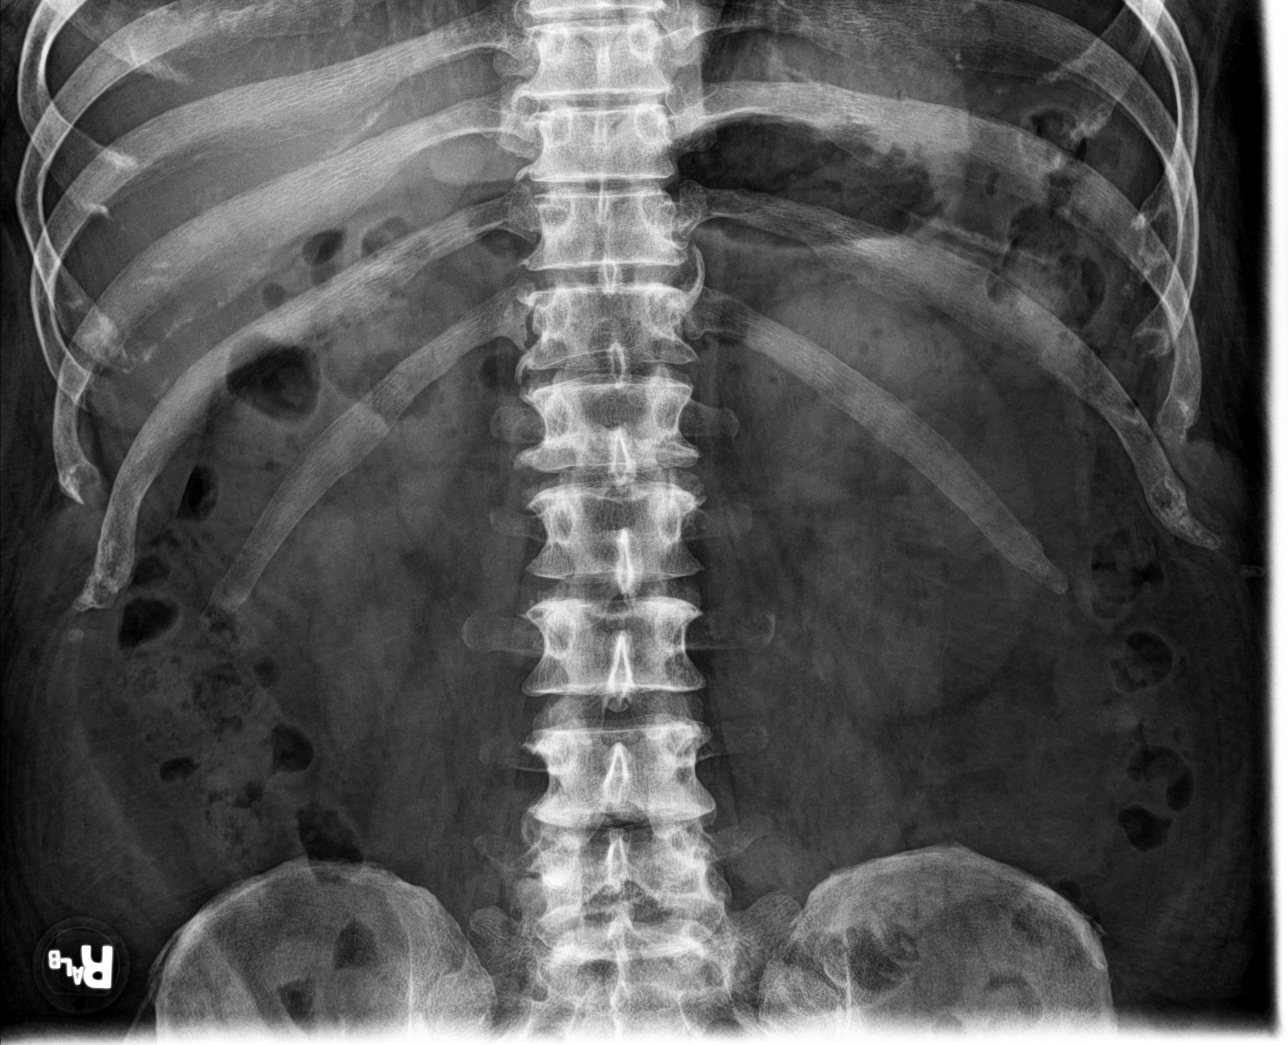

[4 of 4 positions shown; findings below may reference images not displayed]

FINDINGS: The bowel gas pattern is normal. There is no evidence of bowel wall
thickening, free intraperitoneal air or suspicious calcification.
Vascular calcifications are noted in the pelvis. There are mild
degenerative changes within the spine.
IMPRESSION: No evidence of bowel obstruction or other active abdominal process
by radiography.

## 2023-07-16 ENCOUNTER — Encounter: Payer: Self-pay | Admitting: Family Medicine

## 2023-07-16 ENCOUNTER — Ambulatory Visit (INDEPENDENT_AMBULATORY_CARE_PROVIDER_SITE_OTHER): Payer: No Typology Code available for payment source | Admitting: Family Medicine

## 2023-07-16 VITALS — BP 116/70 | HR 87 | Ht 67.0 in | Wt 287.6 lb

## 2023-07-16 DIAGNOSIS — E7801 Familial hypercholesterolemia: Secondary | ICD-10-CM | POA: Diagnosis not present

## 2023-07-16 DIAGNOSIS — Z7985 Long-term (current) use of injectable non-insulin antidiabetic drugs: Secondary | ICD-10-CM

## 2023-07-16 DIAGNOSIS — I10 Essential (primary) hypertension: Secondary | ICD-10-CM | POA: Diagnosis not present

## 2023-07-16 DIAGNOSIS — Z0000 Encounter for general adult medical examination without abnormal findings: Secondary | ICD-10-CM

## 2023-07-16 DIAGNOSIS — E119 Type 2 diabetes mellitus without complications: Secondary | ICD-10-CM | POA: Diagnosis not present

## 2023-07-16 NOTE — Progress Notes (Signed)
 Date:  07/16/2023   Name:  Brandon Green   DOB:  January 07, 1971   MRN:  [id number]   Chief Complaint: Annual Exam  Patient is a 53 year old male who presents for a comprehensive physical exam. The patient reports the following problems: none. Health maintenance has been reviewed up to date.      Lab Results  Component Value Date   NA 137 03/24/2023   K 4.3 03/24/2023   CO2 23 03/24/2023   GLUCOSE 244 (H) 03/24/2023   BUN 11 03/24/2023   CREATININE 0.79 03/24/2023   CALCIUM 9.1 03/24/2023   EGFR 108 03/24/2023   GFRNONAA >60 10/25/2019   Lab Results  Component Value Date   CHOL 134 03/24/2023   HDL 29 (L) 03/24/2023   LDLCALC 68 03/24/2023   TRIG 223 (H) 03/24/2023   No results found for: "TSH" Lab Results  Component Value Date   HGBA1C 7.6 (H) 01/03/2022   No results found for: "WBC", "HGB", "HCT", "MCV", "PLT" Lab Results  Component Value Date   ALT 21 01/03/2022   AST 18 01/03/2022   ALKPHOS 74 01/03/2022   BILITOT 0.6 01/03/2022   No results found for: "25OHVITD2", "25OHVITD3", "VD25OH"   Review of Systems  Constitutional:  Negative for chills and fever.  HENT:  Negative for drooling, ear discharge, ear pain and sore throat.   Respiratory:  Negative for cough, shortness of breath and wheezing.   Cardiovascular:  Negative for chest pain, palpitations and leg swelling.  Gastrointestinal:  Negative for abdominal pain, blood in stool, constipation, diarrhea and nausea.  Endocrine: Negative for polydipsia.  Genitourinary:  Negative for dysuria, frequency, hematuria and urgency.  Musculoskeletal:  Negative for back pain, myalgias and neck pain.  Skin:  Negative for rash.  Allergic/Immunologic: Negative for environmental allergies.  Neurological:  Negative for dizziness and headaches.  Hematological:  Does not bruise/bleed easily.  Psychiatric/Behavioral:  Negative for suicidal ideas. The patient is not nervous/anxious.     Patient Active Problem List    Diagnosis Date Noted   Colon cancer screening    Adult BMI 40.0-44.9 kg/sq m (HCC) 11/22/2020   Plaque psoriasis 10/03/2019    Allergies  Allergen Reactions   Percocet [Oxycodone-Acetaminophen] Itching    Past Surgical History:  Procedure Laterality Date   COLONOSCOPY WITH PROPOFOL N/A 06/21/2021   Procedure: COLONOSCOPY WITH PROPOFOL;  Surgeon: Midge Minium, MD;  Location: [facility];  Service: Endoscopy;  Laterality: N/A;   FRACTURE SURGERY     right lower leg    Social History   Tobacco Use   Smoking status: Former    Current packs/day: 0.00    Types: Cigarettes    Quit date: 06/19/2020    Years since quitting: 3.0   Smokeless tobacco: Never  Vaping Use   Vaping status: Never Used  Substance Use Topics   Alcohol use: Not Currently   Drug use: Never     Medication list has been reviewed and updated.  Current Meds  Medication Sig   atorvastatin (LIPITOR) 10 MG tablet Take 1 tablet (10 mg total) by mouth daily.   hydrOXYzine (ATARAX) 25 MG tablet Take 25 mg by mouth at bedtime.   losartan (COZAAR) 25 MG tablet Take 1 tablet (25 mg total) by mouth daily.   MOUNJARO 2.5 MG/0.5ML Pen Inject 2.5 mg into the skin once a week.       07/16/2023    8:16 AM 03/24/2023    2:10 PM 06/13/2022  3:28 PM 04/24/2022    4:19 PM  GAD 7 : Generalized Anxiety Score  Nervous, Anxious, on Edge 0 0 0 0  Control/stop worrying 0 0 0 0  Worry too much - different things 0 0 0 0  Trouble relaxing 0 0 0 0  Restless 0 0 0 0  Easily annoyed or irritable 0 0 0 0  Afraid - awful might happen 0 0 0 0  Total GAD 7 Score 0 0 0 0  Anxiety Difficulty Not difficult at all Not difficult at all Not difficult at all Not difficult at all       07/16/2023    8:16 AM 03/24/2023    2:10 PM 06/13/2022    3:28 PM  Depression screen PHQ 2/9  Decreased Interest 0 0 0  Down, Depressed, Hopeless 0 0 0  PHQ - 2 Score 0 0 0  Altered sleeping 0 0   Tired, decreased energy 0 0   Change in  appetite 0 0   Feeling bad or failure about yourself  0 0   Trouble concentrating 0 0   Moving slowly or fidgety/restless 0 0   Suicidal thoughts 0 0   PHQ-9 Score 0 0   Difficult doing work/chores Not difficult at all Not difficult at all Not difficult at all    BP Readings from Last 3 Encounters:  07/16/23 116/70  03/24/23 102/76  06/13/22 128/72    Physical Exam Vitals and nursing note reviewed.  Constitutional:      Appearance: Normal appearance. He is well-developed and overweight.  HENT:     Head: Normocephalic and atraumatic.     Right Ear: Hearing, tympanic membrane, ear canal and external ear normal.     Left Ear: Hearing, tympanic membrane, ear canal and external ear normal.     Nose: Nose normal.     Mouth/Throat:     Lips: Pink.     Mouth: Mucous membranes are moist.     Dentition: Normal dentition.     Tongue: No lesions.     Palate: No mass.     Pharynx: Oropharynx is clear. Uvula midline.  Eyes:     General: Lids are normal. Vision grossly intact. Gaze aligned appropriately. No scleral icterus.    Extraocular Movements: Extraocular movements intact.     Conjunctiva/sclera: Conjunctivae normal.     Pupils: Pupils are equal, round, and reactive to light.  Neck:     Thyroid: No thyroid mass, thyromegaly or thyroid tenderness.     Vascular: No carotid bruit, hepatojugular reflux or JVD.     Trachea: Trachea normal. No tracheal deviation.  Cardiovascular:     Rate and Rhythm: Normal rate and regular rhythm.     Chest Wall: PMI is not displaced.     Pulses: Normal pulses.          Carotid pulses are 2+ on the right side and 2+ on the left side.      Radial pulses are 2+ on the right side and 2+ on the left side.       Femoral pulses are 2+ on the right side and 2+ on the left side.      Popliteal pulses are 2+ on the right side and 2+ on the left side.       Dorsalis pedis pulses are 2+ on the right side and 2+ on the left side.       Posterior tibial pulses  are 2+ on the right side and  2+ on the left side.     Heart sounds: Normal heart sounds, S1 normal and S2 normal. Heart sounds not distant. No murmur heard.    No systolic murmur is present.     No diastolic murmur is present.     No friction rub. No gallop. No S3 or S4 sounds.  Pulmonary:     Effort: Pulmonary effort is normal. No prolonged expiration.     Breath sounds: Normal breath sounds. No decreased breath sounds, wheezing, rhonchi or rales.  Chest:  Breasts:    Breasts are symmetrical.     Right: Normal. No mass.     Left: Normal. No mass.  Abdominal:     General: Bowel sounds are normal.     Palpations: Abdomen is soft. There is no hepatomegaly, splenomegaly or mass.     Tenderness: There is no abdominal tenderness.     Hernia: No hernia is present. There is no hernia in the left inguinal area.  Genitourinary:    Penis: Uncircumcised.      Testes: Normal.  Musculoskeletal:        General: Normal range of motion.     Cervical back: Normal, normal range of motion and neck supple.     Thoracic back: Normal.     Lumbar back: Normal.     Right lower leg: No edema.     Left lower leg: No edema.  Lymphadenopathy:     Head:     Right side of head: No submental or submandibular adenopathy.     Left side of head: No submental or submandibular adenopathy.     Cervical: No cervical adenopathy.     Right cervical: No superficial, deep or posterior cervical adenopathy.    Left cervical: No superficial, deep or posterior cervical adenopathy.     Upper Body:     Right upper body: No supraclavicular adenopathy.     Left upper body: No supraclavicular adenopathy.     Lower Body: No right inguinal adenopathy. No left inguinal adenopathy.  Skin:    General: Skin is warm and dry.     Capillary Refill: Capillary refill takes less than 2 seconds.     Findings: No rash.  Neurological:     Mental Status: He is alert.     Cranial Nerves: Cranial nerves 2-12 are intact.     Sensory:  Sensation is intact. No sensory deficit.     Motor: Motor function is intact.     Deep Tendon Reflexes: Reflexes are normal and symmetric.     Reflex Scores:      Bicep reflexes are 2+ on the right side and 2+ on the left side.      Patellar reflexes are 2+ on the right side and 2+ on the left side. Psychiatric:        Mood and Affect: Mood is not anxious or depressed.        Behavior: Behavior is cooperative.     Wt Readings from Last 3 Encounters:  07/16/23 287 lb 9.6 oz (130.5 kg)  03/24/23 287 lb (130.2 kg)  06/13/22 285 lb (129.3 kg)    BP 116/70   Pulse 87   Ht 5\' 7"  (1.702 m)   Wt 287 lb 9.6 oz (130.5 kg)   SpO2 96%   BMI 45.04 kg/m   Assessment and Plan: Brandon Green is a 53 y.o. male who presents today for his Complete Annual Exam. He feels well. He reports exercising at work. He reports  he is sleeping well. Immunizations are reviewed and recommendations provided.   Age appropriate screening tests are discussed. Counseling given for risk factor reduction interventions.   1. Annual physical exam (Primary) No subjective/objective concerns noted during HPI, review of past medical history/review of medications/review of labs within the past year, review of systems and physical exam.  Usual labs including lipid panel and CMP obtained.  Review of the health maintenance up-to-date. - Lipid Panel With LDL/HDL Ratio - Comprehensive metabolic panel  2. Type 2 diabetes mellitus without complication, without long-term current use of insulin [facility]) Patient followed by endocrinology.  Currently on Mounjaro.  Patient will check microalbuminuria for status of glomerulonephritis. - Microalbumin / creatinine urine ratio  3. Familial hypercholesterolemia Mention only for lab obtain for control of LDL. - Lipid Panel With LDL/HDL Ratio  4. Primary hypertension Mention only for lab obtained for electrolytes and GFR. - Comprehensive metabolic panel    Elizabeth Sauer, MD

## 2023-07-17 ENCOUNTER — Telehealth: Payer: Self-pay | Admitting: Family Medicine

## 2023-07-17 LAB — LIPID PANEL WITH LDL/HDL RATIO
Cholesterol, Total: 138 mg/dL (ref 100–199)
HDL: 37 mg/dL — ABNORMAL LOW (ref >39)
LDL Chol Calc (NIH): 85 mg/dL (ref 0–99)
LDL/HDL Ratio: 2.3 ratio (ref 0.0–3.6)
Triglycerides: 80 mg/dL (ref 0–149)
VLDL Cholesterol Cal: 16 mg/dL (ref 5–40)

## 2023-07-17 LAB — COMPREHENSIVE METABOLIC PANEL
ALT: 20 IU/L (ref 0–44)
AST: 17 IU/L (ref 0–40)
Albumin: 4.2 g/dL (ref 4.1–5.1)
Alkaline Phosphatase: 75 IU/L (ref 44–121)
BUN/Creatinine Ratio: 20 (ref 9–20)
BUN: 16 mg/dL (ref 6–24)
Bilirubin Total: 0.6 mg/dL (ref 0.0–1.2)
CO2: 22 mmol/L (ref 20–29)
Calcium: 9.3 mg/dL (ref 8.7–10.2)
Chloride: 103 mmol/L (ref 96–106)
Creatinine, Ser: 0.82 mg/dL (ref 0.76–1.27)
Globulin, Total: 2.8 g/dL (ref 1.5–4.5)
Glucose: 141 mg/dL — ABNORMAL HIGH (ref 70–99)
Potassium: 4.8 mmol/L (ref 3.5–5.2)
Sodium: 141 mmol/L (ref 134–144)
Total Protein: 7 g/dL (ref 6.0–8.5)
eGFR: 107 mL/min/{1.73_m2} (ref >59)

## 2023-07-17 NOTE — Telephone Encounter (Signed)
 Faxed form, and put in scan pile to be scanned in patient chart.

## 2023-07-17 NOTE — Telephone Encounter (Signed)
 Copied from CRM [id number]. Topic: General - Other >> Jul 17, 2023  9:36 AM Shelah Lewandowsky wrote: Reason for CRM: Message for Chassidy- Fax number to work office for physical form- [phone]

## 2023-08-17 ENCOUNTER — Ambulatory Visit
Admission: RE | Admit: 2023-08-17 | Discharge: 2023-08-17 | Disposition: A | Discharge status: Home / Self Care | Source: Ambulatory Visit | Attending: Emergency Medicine | Admitting: Emergency Medicine

## 2023-08-17 VITALS — BP 139/85 | HR 60 | Temp 97.8°F | Resp 18

## 2023-08-17 DIAGNOSIS — M546 Pain in thoracic spine: Secondary | ICD-10-CM

## 2023-08-17 LAB — URINALYSIS, W/ REFLEX TO CULTURE (INFECTION SUSPECTED)
Bacteria, UA: NONE SEEN
Bilirubin Urine: NEGATIVE
Glucose, UA: NEGATIVE mg/dL
Hgb urine dipstick: NEGATIVE
Ketones, ur: NEGATIVE mg/dL
Leukocytes,Ua: NEGATIVE
Nitrite: NEGATIVE
Protein, ur: NEGATIVE mg/dL
RBC / HPF: NONE SEEN RBC/hpf (ref 0–5)
Specific Gravity, Urine: 1.02 (ref 1.005–1.030)
WBC, UA: NONE SEEN WBC/hpf (ref 0–5)
pH: 7.5 (ref 5.0–8.0)

## 2023-08-17 MED ORDER — KETOROLAC TROMETHAMINE 30 MG/ML IJ SOLN
30.0000 mg | Freq: Once | INTRAMUSCULAR | Status: AC
  Administered 2023-08-17: 30 mg via INTRAMUSCULAR

## 2023-08-17 MED ORDER — BACLOFEN 10 MG PO TABS: 10.0000 mg | ORAL_TABLET | Freq: Three times a day (TID) | ORAL | 0 refills | Status: AC

## 2023-08-17 MED ORDER — IBUPROFEN 600 MG PO TABS: 600.0000 mg | ORAL_TABLET | Freq: Four times a day (QID) | ORAL | 0 refills | Status: AC | PRN

## 2023-08-17 NOTE — ED Provider Notes (Signed)
 [facility]    CSN: [id number] Arrival date & time: 08/17/23  0809      History   Chief Complaint Chief Complaint  Patient presents with   Back Pain    Hurts about where kidney is on right side - Entered by patient    HPI Brandon Green is a 53 y.o. male.   HPI  53 year old male with past medical history significant for sleep apnea, psoriasis, hypertension, and diabetes presents for evaluation of acute right-sided flank pain that started this morning.  He reports that he retrieved dog food from the refrigerator and he was sitting at the counter cutting it when the pain hit him suddenly.  The pain is worse with movement.  He denies any heavy lifting or falls.  He also denies any pain with urination, urinary urgency or frequency, or blood in his urine.  He has no history of kidney stones.  He does have some mild nausea.  He has not take anything for pain.  Past Medical History:  Diagnosis Date   Diabetes mellitus without complication (HCC)    Hypertension    Psoriasis    Sleep apnea    Cannot wear mask    Patient Active Problem List   Diagnosis Date Noted   Colon cancer screening    Adult BMI 40.0-44.9 kg/sq m (HCC) 11/22/2020   Plaque psoriasis 10/03/2019    Past Surgical History:  Procedure Laterality Date   COLONOSCOPY WITH PROPOFOL N/A 06/21/2021   Procedure: COLONOSCOPY WITH PROPOFOL;  Surgeon: Midge Minium, MD;  Location: [facility];  Service: Endoscopy;  Laterality: N/A;   FRACTURE SURGERY     right lower leg       Home Medications    Prior to Admission medications   Medication Sig Start Date End Date Taking? Authorizing Provider  baclofen (LIORESAL) 10 MG tablet Take 1 tablet (10 mg total) by mouth 3 (three) times daily. 08/17/23  Yes Becky Augusta, NP  atorvastatin (LIPITOR) 10 MG tablet Take 1 tablet (10 mg total) by mouth daily. 03/24/23   Duanne Limerick, MD  Blood Glucose Monitoring Suppl (FREESTYLE FREEDOM) KIT 1 Units by Does not  apply route in the morning and at bedtime. 10/25/19   Domenick Gong, MD  glucose blood test strip Check your sugar in the morning before you eat breakfast, and one hour after a meal. 10/25/19   Domenick Gong, MD  hydrOXYzine (ATARAX) 25 MG tablet Take 25 mg by mouth at bedtime. 02/26/23   [provider]  ibuprofen (ADVIL) 600 MG tablet Take 1 tablet (600 mg total) by mouth every 6 (six) hours as needed. 08/17/23   Becky Augusta, NP  losartan (COZAAR) 25 MG tablet Take 1 tablet (25 mg total) by mouth daily. 03/24/23   Duanne Limerick, MD  metFORMIN (GLUCOPHAGE-XR) 750 MG 24 hr tablet Take 750 mg by mouth daily with breakfast. Patient not taking: Reported on 07/16/2023    [provider]  MOUNJARO 2.5 MG/0.5ML Pen Inject 2.5 mg into the skin once a week.    [provider]  sildenafil (VIAGRA) 100 MG tablet Take 1 tablet (100 mg total) by mouth as needed for erectile dysfunction. 03/24/23   Duanne Limerick, MD  terbinafine (LAMISIL) 250 MG tablet Take 250 mg by mouth daily. 02/26/23   [provider]    Family History Family History  Problem Relation Age of Onset   Diabetes Mother    Diabetes Brother    Cancer Maternal  Aunt    Heart disease Maternal Uncle    Stroke Maternal Uncle    Cancer Maternal Grandfather    Heart disease Maternal Grandfather    Hypertension Maternal Grandfather     Social History Social History   Tobacco Use   Smoking status: Former    Current packs/day: 0.00    Types: Cigarettes    Quit date: 06/19/2020    Years since quitting: 3.1   Smokeless tobacco: Never  Vaping Use   Vaping status: Never Used  Substance Use Topics   Alcohol use: Not Currently   Drug use: Never     Allergies   Percocet [oxycodone-acetaminophen]   Review of Systems Review of Systems  Constitutional:  Negative for fever.  Gastrointestinal:  Positive for nausea.  Genitourinary:  Positive for flank pain. Negative for dysuria, frequency,  hematuria and urgency.  Musculoskeletal:  Positive for back pain.     Physical Exam Triage Vital Signs ED Triage Vitals  Encounter Vitals Group     BP 08/17/23 0827 139/85     Systolic BP Percentile --      Diastolic BP Percentile --      Pulse Rate 08/17/23 0827 60     Resp 08/17/23 0827 18     Temp 08/17/23 0827 97.8 F (36.6 C)     Temp Source 08/17/23 0827 Oral     SpO2 08/17/23 0827 98 %     Weight --      Height --      Head Circumference --      Peak Flow --      Pain Score 08/17/23 0828 10     Pain Loc --      Pain Education --      Exclude from Growth Chart --    No data found.  Updated Vital Signs BP 139/85 (BP Location: Left Arm)   Pulse 60   Temp 97.8 F (36.6 C) (Oral)   Resp 18   SpO2 98%   Visual Acuity Right Eye Distance:   Left Eye Distance:   Bilateral Distance:    Right Eye Near:   Left Eye Near:    Bilateral Near:     Physical Exam Vitals and nursing note reviewed.  Constitutional:      General: He is in acute distress.     Appearance: Normal appearance. He is not ill-appearing.     Comments: Patient appears to be in a moderate degree of pain.  HENT:     Head: Normocephalic and atraumatic.  Abdominal:     Tenderness: There is right CVA tenderness. There is no left CVA tenderness.  Musculoskeletal:        General: Tenderness present. Normal range of motion.  Skin:    General: Skin is warm and dry.     Capillary Refill: Capillary refill takes less than 2 seconds.  Neurological:     General: No focal deficit present.     Mental Status: He is alert and oriented to person, place, and time.      UC Treatments / Results  Labs (all labs ordered are listed, but only abnormal results are displayed) Labs Reviewed  URINALYSIS, W/ REFLEX TO CULTURE (INFECTION SUSPECTED)    EKG   Radiology No results found.  Procedures Procedures (including critical care time)  Medications Ordered in UC Medications  ketorolac (TORADOL) 30  MG/ML injection 30 mg (30 mg Intramuscular Given 08/17/23 0912)    Initial Impression / Assessment and Plan / UC  Course  I have reviewed the triage vital signs and the nursing notes.  Pertinent labs & imaging results that were available during my care of the patient were reviewed by me and considered in my medical decision making (see chart for details).   Patient is a pleasant 53 year old male who does appear to be in a moderate degree of pain presenting for evaluation of acute onset right flank pain as outlined HPI above.  Physical exam does reveal tenderness with palpation of the lower thoracic and lumbar paraspinous region on the right without overt spasm.  No midline process tenderness or step-off in the lower thoracic or lumbar spine.  The patient does have CVA tenderness on the right as well.  The pain does increase with movement but is slightly improved by forward flexion.  Extension aggravates the pain as is lateral rotation and lateral flexion.  Differential diagnosis include musculoskeletal thoracic back pain versus kidney stone.  Given that patient has not taken anything for pain I will have staff administer 30 mg of Toradol and order urinalysis to evaluate for the presence of blood or infection.  Urinalysis is completely unremarkable.  Specifically no blood noted.  No amorphous crystals or calcium oxalate seen on reflex microscopy.  I will discharge patient on the diagnosis of thoracic musculoskeletal back pain and treat him with 6 and milligrams ibuprofen every 6 hours up with pain and inflammation along with 10 mg baclofen every 8 hours help with muscle spasm.  Moist heat Home physical therapy exercises prescribed.  Return precautions reviewed.  Work note provided.   Final Clinical Impressions(s) / UC Diagnoses   Final diagnoses:  Acute right-sided thoracic back pain     Discharge Instructions      Take the ibuprofen, 600 mg every 6 hours with food, on a schedule for the next  48 hours and then as needed.  Take the baclofen, 10 mg every 8 hours, on a schedule for the next 48 hours and then as needed.  Apply moist heat to your back for 30 minutes at a time 2-3 times a day to improve blood flow to the area and help remove the lactic acid causing the spasm.  Follow the back exercises given at discharge.  Return for reevaluation for any new or worsening symptoms.      ED Prescriptions     Medication Sig Dispense Auth. Provider   ibuprofen (ADVIL) 600 MG tablet Take 1 tablet (600 mg total) by mouth every 6 (six) hours as needed. 30 tablet Becky Augusta, NP   baclofen (LIORESAL) 10 MG tablet Take 1 tablet (10 mg total) by mouth 3 (three) times daily. 30 each Becky Augusta, NP      PDMP not reviewed this encounter.   Becky Augusta, NP 08/17/23 [phone]

## 2023-08-17 NOTE — ED Triage Notes (Signed)
 Patient presents to UC for right sided back pain since this morning. States he was "just standing at the counter cutting dog food up." Not taking any OTC meds.   Denies injury or urinary symptoms.

## 2023-08-17 NOTE — Discharge Instructions (Signed)

## 2023-09-11 ENCOUNTER — Ambulatory Visit: Payer: Self-pay | Admitting: Family Medicine

## 2023-09-29 ENCOUNTER — Ambulatory Visit: Admitting: Family Medicine

## 2023-11-06 ENCOUNTER — Encounter: Payer: Self-pay | Admitting: Emergency Medicine

## 2023-11-06 ENCOUNTER — Ambulatory Visit
Admission: EM | Admit: 2023-11-06 | Discharge: 2023-11-06 | Disposition: A | Discharge status: Home / Self Care | Attending: Emergency Medicine | Admitting: Emergency Medicine

## 2023-11-06 DIAGNOSIS — R509 Fever, unspecified: Secondary | ICD-10-CM

## 2023-11-06 DIAGNOSIS — N390 Urinary tract infection, site not specified: Secondary | ICD-10-CM

## 2023-11-06 LAB — URINALYSIS, W/ REFLEX TO CULTURE (INFECTION SUSPECTED)
Glucose, UA: NEGATIVE mg/dL
Ketones, ur: 15 mg/dL — AB
Nitrite: POSITIVE — AB
Protein, ur: 100 mg/dL — AB
Specific Gravity, Urine: >1.03 — ABNORMAL HIGH (ref 1.005–1.030)
WBC, UA: >50 WBC/hpf (ref 0–5)
pH: 5.5 (ref 5.0–8.0)

## 2023-11-06 LAB — RESP PANEL BY RT-PCR (FLU A&B, COVID) ARPGX2
Influenza A by PCR: NEGATIVE
Influenza B by PCR: NEGATIVE
SARS Coronavirus 2 by RT PCR: NEGATIVE

## 2023-11-06 MED ORDER — CEFTRIAXONE SODIUM 1 G IJ SOLR
1.0000 g | Freq: Once | INTRAMUSCULAR | Status: AC
  Administered 2023-11-06: 1 g via INTRAMUSCULAR

## 2023-11-06 MED ORDER — LEVOFLOXACIN 750 MG PO TABS: 750.0000 mg | ORAL_TABLET | Freq: Every day | ORAL | 0 refills | Status: DC

## 2023-11-06 NOTE — ED Provider Notes (Signed)
 [facility]    CSN: [id number] Arrival date & time: 11/06/23  0816      History   Chief Complaint Chief Complaint  Patient presents with   Fever    HPI Brandon Green is a 53 y.o. male.   53 year old male pt, Brandon Green, presents to urgent care for evaluation of fever,body aches, dark cloudy urine.  Patient states he has pain with urination and has to hold his urine until he gets to the restroom or he feels he will will have an accident.  Patient reports frequency and urgency.  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  Pmh: Hypertension, hyperlipidemia, obesity  The history is provided by the patient. No language interpreter was used.    Past Medical History:  Diagnosis Date   Diabetes mellitus without complication (HCC)    Hypertension    Psoriasis    Sleep apnea    Cannot wear mask    Patient Active Problem List   Diagnosis Date Noted   Acute UTI (urinary tract infection) 11/06/2023   Febrile illness 11/06/2023   Colon cancer screening    Adult BMI 40.0-44.9 kg/sq m (HCC) 11/22/2020   Plaque psoriasis 10/03/2019    Past Surgical History:  Procedure Laterality Date   COLONOSCOPY WITH PROPOFOL  N/A 06/21/2021   Procedure: COLONOSCOPY WITH PROPOFOL ;  Surgeon: Marnee Sink, MD;  Location: [facility];  Service: Endoscopy;  Laterality: N/A;   FRACTURE SURGERY     right lower leg       Home Medications    Prior to Admission medications   Medication Sig Start Date End Date Taking? Authorizing Provider  atorvastatin  (LIPITOR) 10 MG tablet Take 1 tablet (10 mg total) by mouth daily. 03/24/23  Yes Jones, Deanna C, MD  hydrOXYzine (ATARAX) 25 MG tablet Take 25 mg by mouth at bedtime. 02/26/23  Yes [provider]  levofloxacin (LEVAQUIN) 750 MG tablet Take 1 tablet (750 mg total) by mouth daily for 5 days. 11/07/23 11/12/23 Yes Luwanda Starr, Eveleen Hinds, NP   losartan  (COZAAR ) 25 MG tablet Take 1 tablet (25 mg total) by mouth daily. 03/24/23  Yes Jones, Deanna C, MD  MOUNJARO 2.5 MG/0.5ML Pen Inject 2.5 mg into the skin once a week.   Yes [provider]  sildenafil  (VIAGRA ) 100 MG tablet Take 1 tablet (100 mg total) by mouth as needed for erectile dysfunction. 03/24/23  Yes Jones, Deanna C, MD  terbinafine (LAMISIL) 250 MG tablet Take 250 mg by mouth daily. 02/26/23  Yes [provider]  baclofen  (LIORESAL ) 10 MG tablet Take 1 tablet (10 mg total) by mouth 3 (three) times daily. 08/17/23   Kent Pear, NP  Blood Glucose Monitoring Suppl (FREESTYLE FREEDOM) KIT 1 Units by Does not apply route in the morning and at bedtime. 10/25/19   Ethlyn Herd, MD  glucose blood test strip Check your sugar in the morning before you eat breakfast, and one hour after a meal. 10/25/19   Ethlyn Herd, MD  ibuprofen  (ADVIL ) 600 MG tablet Take 1 tablet (600 mg total) by mouth every 6 (six) hours as needed. 08/17/23   Kent Pear, NP  metFORMIN  (GLUCOPHAGE -XR) 750 MG 24 hr tablet Take 750 mg by mouth daily with breakfast. Patient not taking: Reported on 07/16/2023    [provider]    Family History Family History  Problem Relation Age of Onset   Diabetes Mother  Diabetes Brother    Cancer Maternal Aunt    Heart disease Maternal Uncle    Stroke Maternal Uncle    Cancer Maternal Grandfather    Heart disease Maternal Grandfather    Hypertension Maternal Grandfather     Social History Social History   Tobacco Use   Smoking status: Former    Current packs/day: 0.00    Types: Cigarettes    Quit date: 06/19/2020    Years since quitting: 3.3   Smokeless tobacco: Never  Vaping Use   Vaping status: Never Used  Substance Use Topics   Alcohol use: Not Currently   Drug use: Never     Allergies   Percocet [oxycodone-acetaminophen ]   Review of Systems Review of Systems  Constitutional:  Positive for fever.  Genitourinary:   Positive for dysuria and frequency.  Musculoskeletal:  Positive for myalgias.  All other systems reviewed and are negative.    Physical Exam Triage Vital Signs ED Triage Vitals  Encounter Vitals Group     BP 11/06/23 0833 103/67     Girls Systolic BP Percentile --      Girls Diastolic BP Percentile --      Boys Systolic BP Percentile --      Boys Diastolic BP Percentile --      Pulse Rate 11/06/23 0833 (!) 112     Resp 11/06/23 0833 18     Temp 11/06/23 0833 98.8 F (37.1 C)     Temp Source 11/06/23 0833 Oral     SpO2 11/06/23 0833 95 %     Weight 11/06/23 0829 287 lb 11.2 oz (130.5 kg)     Height 11/06/23 0829 5' 7 (1.702 m)     Head Circumference --      Peak Flow --      Pain Score 11/06/23 0829 0     Pain Loc --      Pain Education --      Exclude from Growth Chart --    No data found.  Updated Vital Signs BP 103/67 (BP Location: Right Arm)   Pulse (!) 112   Temp 98.8 F (37.1 C) (Oral) Comment: Pt states he had Aleve at 6am  Resp 18   Ht 5' 7 (1.702 m)   Wt 287 lb 11.2 oz (130.5 kg)   SpO2 95%   BMI 45.06 kg/m   Visual Acuity Right Eye Distance:   Left Eye Distance:   Bilateral Distance:    Right Eye Near:   Left Eye Near:    Bilateral Near:     Physical Exam Vitals and nursing note reviewed.  Constitutional:      General: He is not in acute distress.    Appearance: He is well-developed. He is obese.  HENT:     Head: Normocephalic and atraumatic.     Right Ear: Tympanic membrane normal.     Left Ear: Tympanic membrane normal.     Nose: Nose normal.     Mouth/Throat:     Lips: Pink.     Mouth: Mucous membranes are moist.     Pharynx: Oropharynx is clear. Uvula midline.   Eyes:     Conjunctiva/sclera: Conjunctivae normal.    Cardiovascular:     Rate and Rhythm: Regular rhythm. Tachycardia present.     Heart sounds: Normal heart sounds. No murmur heard. Pulmonary:     Effort: Pulmonary effort is normal. No respiratory distress.      Breath sounds: Normal breath sounds and air entry.  Abdominal:     Palpations: Abdomen is soft.     Tenderness: There is no abdominal tenderness.   Musculoskeletal:        General: No swelling.     Cervical back: Neck supple.   Skin:    General: Skin is warm and dry.     Capillary Refill: Capillary refill takes less than 2 seconds.   Neurological:     General: No focal deficit present.     Mental Status: He is alert and oriented to person, place, and time.     GCS: GCS eye subscore is 4. GCS verbal subscore is 5. GCS motor subscore is 6.   Psychiatric:        Mood and Affect: Mood normal.      UC Treatments / Results  Labs (all labs ordered are listed, but only abnormal results are displayed) Labs Reviewed  URINALYSIS, W/ REFLEX TO CULTURE (INFECTION SUSPECTED) - Abnormal; Notable for the following components:      Result Value   Color, Urine AMBER (*)    APPearance CLOUDY (*)    Specific Gravity, Urine >1.030 (*)    Hgb urine dipstick MODERATE (*)    Bilirubin Urine SMALL (*)    Ketones, ur 15 (*)    Protein, ur 100 (*)    Nitrite POSITIVE (*)    Leukocytes,Ua SMALL (*)    Bacteria, UA MANY (*)    All other components within normal limits  RESP PANEL BY RT-PCR (FLU A&B, COVID) ARPGX2  URINE CULTURE    EKG   Radiology No results found.  Procedures Procedures (including critical care time)  Medications Ordered in UC Medications  cefTRIAXone (ROCEPHIN) injection 1 g (1 g Intramuscular Given 11/06/23 0952)    Initial Impression / Assessment and Plan / UC Course  I have reviewed the triage vital signs and the nursing notes.  Pertinent labs & imaging results that were available during my care of the patient were reviewed by me and considered in my medical decision making (see chart for details).  Clinical Course as of 11/06/23 2109  Fri Nov 06, 2023  0917 Ua: positive for UTI: +cloudy,amber, moderate hgb, 15 ketones, protein+, nitrate positive, WBC over 50,  many bacteria, urine culture pending. Will treat with Rocephin 1 gm IM, levoquin 750mg  po x 5 days [JD]    Clinical Course User Index [JD] Alec Jaros, Eveleen Hinds, NP   Discussed exam findings and plan of care with patient, Rocephin 1 g IM given in office for systemic management , Levaquin scripted , urine culture pending , patient aware if urine culture is positive and additional or different antibiotic is indicated patient will be notified , strict go to ER precautions given, patient verbalized understanding to this provider  Ddx: Acute UTI, pyelonephritis, kidney stone, urosepsis, viral illness Final Clinical Impressions(s) / UC Diagnoses   Final diagnoses:  Acute UTI (urinary tract infection)  Febrile illness     Discharge Instructions      Please call and follow up with PCP next week, sooner if worse Push fluids, we are treating you for UTI, given Rocephin  1 gm IM in office, scripted Levaquin daily. Urine culture pending, if antibiotic needs to be changed you will be notified. Drink plenty of water , avoid caffeine.  Covid and flu are negative.  If you are unable to keep your antibiotic down, unable to keep fluids down, have worsening symptoms go immediately to the emergency room for further evaluation.    ED Prescriptions  Medication Sig Dispense Auth. Provider   levofloxacin (LEVAQUIN) 750 MG tablet Take 1 tablet (750 mg total) by mouth daily for 5 days. 5 tablet Christophe Rising, Eveleen Hinds, NP      PDMP not reviewed this encounter.   Peter Brands, NP 11/06/23 2109

## 2023-11-06 NOTE — ED Triage Notes (Addendum)
 Pt c/o subjective fever, body aches. Dark urine and cloudy. Started yesterday. He states he did a home covid and flu test and was negative. Denies dysuria or other URI symptoms.

## 2023-11-06 NOTE — Discharge Instructions (Addendum)
 Please call and follow up with PCP next week, sooner if worse Push fluids, we are treating you for UTI, given Rocephin  1 gm IM in office, scripted Levaquin daily. Urine culture pending, if antibiotic needs to be changed you will be notified. Drink plenty of water , avoid caffeine.  Covid and flu are negative.  If you are unable to keep your antibiotic down, unable to keep fluids down, have worsening symptoms go immediately to the emergency room for further evaluation.

## 2023-11-08 LAB — URINE CULTURE: Culture: >=100000 — AB

## 2023-11-10 ENCOUNTER — Ambulatory Visit (HOSPITAL_COMMUNITY): Payer: Self-pay

## 2023-11-10 ENCOUNTER — Encounter: Payer: Self-pay | Admitting: Family Medicine

## 2023-11-10 ENCOUNTER — Ambulatory Visit: Admitting: Family Medicine

## 2023-11-10 VITALS — BP 130/80 | HR 92 | Temp 98.5°F | Ht 67.0 in | Wt 279.2 lb

## 2023-11-10 DIAGNOSIS — N390 Urinary tract infection, site not specified: Secondary | ICD-10-CM

## 2023-11-10 LAB — POCT URINALYSIS DIPSTICK
Bilirubin, UA: NEGATIVE
Blood, UA: NEGATIVE
Glucose, UA: NEGATIVE
Ketones, UA: NEGATIVE
Leukocytes, UA: NEGATIVE
Nitrite, UA: NEGATIVE
Protein, UA: NEGATIVE
Spec Grav, UA: 1.02 (ref 1.010–1.025)
Urobilinogen, UA: 0.2 U/dL
pH, UA: 6 (ref 5.0–8.0)

## 2023-11-10 MED ORDER — LEVOFLOXACIN 750 MG PO TABS: 750.0000 mg | ORAL_TABLET | Freq: Every day | ORAL | 0 refills | Status: DC

## 2023-11-10 MED ORDER — LEVOFLOXACIN 750 MG PO TABS: 750.0000 mg | ORAL_TABLET | Freq: Every day | ORAL | 0 refills | Status: AC

## 2023-11-10 NOTE — Progress Notes (Signed)
   Acute Office Visit  Subjective:     Patient ID: Brandon Green, male    DOB: [birth year], 53 y.o.   MRN: [id number]  No chief complaint on file.    Patient is in today for UTI follow up. Seen in UC and was diagnosed with complicated UTI, started on levofloxacin . He still has some lingering left flank pain, some burning sensation, most of other symptoms resolved: fever,dark colored urine.  Review of Systems  All other systems reviewed and are negative.       Objective:    BP 130/80   Pulse 92   Temp 98.5 F (36.9 C) (Oral)   Ht 5' 7 (1.702 m)   Wt 279 lb 4 oz (126.7 kg)   SpO2 97%   BMI 43.74 kg/m    Physical Exam Vitals and nursing note reviewed.  Constitutional:      Appearance: Normal appearance.  HENT:     Head: Normocephalic.     Right Ear: External ear normal.     Left Ear: External ear normal.   Eyes:     Conjunctiva/sclera: Conjunctivae normal.    Cardiovascular:     Rate and Rhythm: Normal rate.  Pulmonary:     Effort: Pulmonary effort is normal. No respiratory distress.  Abdominal:     Palpations: Abdomen is soft.   Musculoskeletal:        General: Normal range of motion.   Skin:    General: Skin is warm.   Neurological:     Mental Status: He is alert and oriented to person, place, and time.   Psychiatric:        Mood and Affect: Mood normal.     Results for orders placed or performed in visit on 11/10/23  POCT urinalysis dipstick  Result Value Ref Range   Color, UA amber    Clarity, UA cloudy    Glucose, UA Negative Negative   Bilirubin, UA negative    Ketones, UA negative    Spec Grav, UA 1.020 1.010 - 1.025   Blood, UA negative    pH, UA 6.0 5.0 - 8.0   Protein, UA Negative Negative   Urobilinogen, UA 0.2 0.2 or 1.0 E.U./dL   Nitrite, UA negative    Leukocytes, UA Negative Negative   Appearance     Odor          Assessment & Plan:   Problem List Items Addressed This Visit       Genitourinary   Acute UTI  (urinary tract infection) - Primary   Relevant Medications   levofloxacin  (LEVAQUIN ) 750 MG tablet   Other Relevant Orders   POCT urinalysis dipstick (Completed)    Meds ordered this encounter  Medications   levofloxacin  (LEVAQUIN ) 750 MG tablet    Sig: Take 1 tablet (750 mg total) by mouth daily for 2 days.    Dispense:  2 tablet    Refill:  0  POCT urine dip unremarkable, since pt still has symptoms will extend antibiotic course to 2 more days to complete 7 day course but will give him 5 days worth medication incase he still has symptoms.  Return in about 6 months (around 05/11/2024) for chronic follow up with PCP.  Vinary K Kadance Mccuistion, MD

## 2023-12-23 ENCOUNTER — Other Ambulatory Visit: Payer: Self-pay | Admitting: Family Medicine

## 2023-12-23 DIAGNOSIS — E7801 Familial hypercholesterolemia: Secondary | ICD-10-CM

## 2023-12-23 DIAGNOSIS — I10 Essential (primary) hypertension: Secondary | ICD-10-CM

## 2023-12-23 NOTE — Telephone Encounter (Unsigned)
 Copied from CRM #[id number]. Topic: Clinical - Medication Refill >> Dec 23, 2023  2:20 PM Nathanel C wrote: Medication: atorvastatin  (LIPITOR) 10 MG tablet losartan  (COZAAR ) 25 MG tablet   Has the patient contacted their pharmacy? Yes   This is the patient's preferred pharmacy:  [facility] [address] [place] - [address] [place] [address] Phone: [phone] Fax: [phone]  Is this the correct pharmacy for this prescription? Yes If no, delete pharmacy and type the correct one.   Has the prescription been filled recently? No  Is the patient out of the medication? Yes  Has the patient been seen for an appointment in the last year OR does the patient have an upcoming appointment? Yes  Can we respond through MyChart? No  Agent: Please be advised that Rx refills may take up to 3 business days. We ask that you follow-up with your pharmacy.

## 2023-12-24 ENCOUNTER — Other Ambulatory Visit: Payer: Self-pay

## 2023-12-24 MED ORDER — ATORVASTATIN CALCIUM 10 MG PO TABS: 10.0000 mg | ORAL_TABLET | Freq: Every day | ORAL | 0 refills | Status: DC

## 2023-12-24 MED ORDER — LOSARTAN POTASSIUM 25 MG PO TABS: 25.0000 mg | ORAL_TABLET | Freq: Every day | ORAL | 0 refills | Status: DC

## 2024-04-02 ENCOUNTER — Other Ambulatory Visit: Payer: Self-pay | Admitting: Family Medicine

## 2024-04-02 DIAGNOSIS — E78019 Familial hypercholesterolemia, unspecified: Secondary | ICD-10-CM

## 2024-04-02 DIAGNOSIS — I10 Essential (primary) hypertension: Secondary | ICD-10-CM

## 2024-04-05 ENCOUNTER — Other Ambulatory Visit: Payer: Self-pay | Admitting: Family Medicine

## 2024-04-05 DIAGNOSIS — I10 Essential (primary) hypertension: Secondary | ICD-10-CM

## 2024-04-05 MED ORDER — LOSARTAN POTASSIUM 25 MG PO TABS: 25.0000 mg | ORAL_TABLET | Freq: Every day | ORAL | 0 refills | Status: AC

## 2024-04-05 NOTE — Telephone Encounter (Signed)
 Please review medication refill request

## 2024-04-05 NOTE — Telephone Encounter (Unsigned)
 Copied from CRM [id number]. Topic: Clinical - Medication Question >> Apr 05, 2024  2:34 PM Leonette SQUIBB wrote: Reason for CRM: Pt called saying he ask for some refills .  The pharmacy told him it was denied because he needed an appt/  He was just in in June and has another appt with Dr, Sol in December.  He wants to know if he can get the refills and come in December.  [phone]

## 2024-04-05 NOTE — Telephone Encounter (Signed)
 Requested Prescriptions  Pending Prescriptions Disp Refills   atorvastatin  (LIPITOR) 10 MG tablet [Pharmacy Med Name: Atorvastatin  Calcium  10 MG Oral Tablet] 90 tablet 0    Sig: Take 1 tablet by mouth once daily     Cardiovascular:  Antilipid - Statins Failed - 04/05/2024 12:57 PM      Failed - Lipid Panel in normal range within the last 12 months    Cholesterol, Total  Date Value Ref Range Status  07/16/2023 138 100 - 199 mg/dL Final   LDL Chol Calc (NIH)  Date Value Ref Range Status  07/16/2023 85 0 - 99 mg/dL Final   HDL  Date Value Ref Range Status  07/16/2023 37 (L) >39 mg/dL Final   Triglycerides  Date Value Ref Range Status  07/16/2023 80 0 - 149 mg/dL Final         Passed - Patient is not pregnant      Passed - Valid encounter within last 12 months    Recent Outpatient Visits           4 months ago Acute UTI (urinary tract infection)   [place] [facility] Kott[facility], Vinay K, MD   8 months ago Annual physical exam   [place] [facility], MD               losartan  (COZAAR ) 25 MG tablet [Pharmacy Med Name: Losartan  Potassium 25 MG Oral Tablet] 90 tablet 0    Sig: Take 1 tablet by mouth once daily     Cardiovascular:  Angiotensin Receptor Blockers Failed - 04/05/2024 12:57 PM      Failed - Cr in normal range and within 180 days    Creatinine, Ser  Date Value Ref Range Status  07/16/2023 0.82 0.76 - 1.27 mg/dL Final         Failed - K in normal range and within 180 days    Potassium  Date Value Ref Range Status  07/16/2023 4.8 3.5 - 5.2 mmol/L Final         Failed - Valid encounter within last 6 months    Recent Outpatient Visits           4 months ago Acute UTI (urinary tract infection)   [place] [facility] at [facility], Vinay K, MD   8 months ago Annual physical exam   [facility], Deanna C, MD              Passed - Patient is not pregnant      Passed - Last BP in normal range    BP Readings from Last 1 Encounters:  11/10/23 130/80

## 2024-04-05 NOTE — Telephone Encounter (Signed)
 Requested medications are due for refill today.  yes  Requested medications are on the active medications list.  yes  Last refill. 12/24/2023 #90 0 rf  Future visit scheduled.   yes  Notes to clinic.  Labs are expired.    Requested Prescriptions  Pending Prescriptions Disp Refills   losartan  (COZAAR ) 25 MG tablet [Pharmacy Med Name: Losartan  Potassium 25 MG Oral Tablet] 90 tablet 0    Sig: Take 1 tablet by mouth once daily     Cardiovascular:  Angiotensin Receptor Blockers Failed - 04/05/2024 12:57 PM      Failed - Cr in normal range and within 180 days    Creatinine, Ser  Date Value Ref Range Status  07/16/2023 0.82 0.76 - 1.27 mg/dL Final         Failed - K in normal range and within 180 days    Potassium  Date Value Ref Range Status  07/16/2023 4.8 3.5 - 5.2 mmol/L Final         Failed - Valid encounter within last 6 months    Recent Outpatient Visits           4 months ago Acute UTI (urinary tract infection)   [place] [facility] at [facility], Vinay K, MD   8 months ago Annual physical exam   [place] [facility] Cathryne JAYSON, MD              Passed - Patient is not pregnant      Passed - Last BP in normal range    BP Readings from Last 1 Encounters:  11/10/23 130/80         Signed Prescriptions Disp Refills   atorvastatin  (LIPITOR) 10 MG tablet 90 tablet 0    Sig: Take 1 tablet by mouth once daily     Cardiovascular:  Antilipid - Statins Failed - 04/05/2024 12:57 PM      Failed - Lipid Panel in normal range within the last 12 months    Cholesterol, Total  Date Value Ref Range Status  07/16/2023 138 100 - 199 mg/dL Final   LDL Chol Calc (NIH)  Date Value Ref Range Status  07/16/2023 85 0 - 99 mg/dL Final   HDL  Date Value Ref Range Status  07/16/2023 37 (L) >39 mg/dL Final   Triglycerides  Date Value Ref Range Status  07/16/2023 80 0 - 149 mg/dL Final          Passed - Patient is not pregnant      Passed - Valid encounter within last 12 months    Recent Outpatient Visits           4 months ago Acute UTI (urinary tract infection)   [place] [facility] at [facility] Kotturi, Vinay K, MD   8 months ago Annual physical exam   [facility] at [facility], Deanna C, MD

## 2024-04-05 NOTE — Telephone Encounter (Signed)
 Patient has been made aware.

## 2024-04-05 NOTE — Telephone Encounter (Signed)
 Refill sent.

## 2024-05-10 ENCOUNTER — Encounter: Admitting: Family Medicine

## 2024-05-23 ENCOUNTER — Encounter: Admitting: Family Medicine
